# Patient Record
Sex: Male | Born: 1970 | Race: White | Hispanic: No | Marital: Married | Smoking: Former smoker
Health system: Southern US, Community
[De-identification: ages and names within clinical notes are randomized; demographics above are authoritative.]

## PROBLEM LIST (undated history)

## (undated) DIAGNOSIS — Z8249 Family history of ischemic heart disease and other diseases of the circulatory system: Secondary | ICD-10-CM

## (undated) DIAGNOSIS — E039 Hypothyroidism, unspecified: Secondary | ICD-10-CM

## (undated) DIAGNOSIS — R079 Chest pain, unspecified: Secondary | ICD-10-CM

## (undated) HISTORY — PX: ABDOMINAL SURGERY: SHX537

## (undated) HISTORY — DX: Family history of ischemic heart disease and other diseases of the circulatory system: Z82.49

## (undated) HISTORY — DX: Hypothyroidism, unspecified: E03.9

## (undated) HISTORY — DX: Chest pain, unspecified: R07.9

---
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
## 2009-01-27 ENCOUNTER — Encounter (HOSPITAL_COMMUNITY): Admission: RE | Admit: 2009-01-27 | Discharge: 2009-04-26 | Payer: Self-pay | Admitting: Endocrinology

## 2010-10-14 ENCOUNTER — Encounter: Payer: Self-pay | Admitting: Endocrinology

## 2013-03-29 ENCOUNTER — Other Ambulatory Visit: Payer: Self-pay | Admitting: Endocrinology

## 2013-03-30 ENCOUNTER — Other Ambulatory Visit: Payer: Self-pay | Admitting: Endocrinology

## 2013-03-30 MED ORDER — LEVOTHYROXINE SODIUM 112 MCG PO TABS: 112.0000 ug | ORAL_TABLET | Freq: Every day | ORAL | Status: DC

## 2013-10-29 ENCOUNTER — Encounter: Payer: Self-pay | Admitting: Cardiovascular Disease

## 2013-10-29 ENCOUNTER — Ambulatory Visit (INDEPENDENT_AMBULATORY_CARE_PROVIDER_SITE_OTHER): Payer: BC Managed Care – PPO | Admitting: Cardiovascular Disease

## 2013-10-29 VITALS — BP 128/92 | HR 89 | Ht 71.0 in | Wt 230.0 lb

## 2013-10-29 DIAGNOSIS — R079 Chest pain, unspecified: Secondary | ICD-10-CM | POA: Insufficient documentation

## 2013-10-29 DIAGNOSIS — Z8249 Family history of ischemic heart disease and other diseases of the circulatory system: Secondary | ICD-10-CM | POA: Insufficient documentation

## 2013-10-29 MED ORDER — ASPIRIN EC 81 MG PO TBEC: 81.0000 mg | DELAYED_RELEASE_TABLET | Freq: Every day | ORAL | Status: DC

## 2013-10-29 NOTE — Assessment & Plan Note (Signed)
Patient developed chest pain this past Sunday. It left precordial pain to his left arm. His affect is included 50-pack-year history of tobacco use having quit 2 years ago. His mother died of an MI at age 42 and his brother at age 52. His lipid profile is unknown but to start him on baby aspirin, finger Grim Reaper 2 labs and order an exercise Myoview stress test. This is abnormal we will proceed with cardiac catheterization to define his anatomy.

## 2013-10-29 NOTE — Patient Instructions (Addendum)
  We will see you back in follow up after the test.  Dr Gwenlyn Found has ordered an exercise myoview at the beginning of next week.  Start Aspirin 74m daily.

## 2013-10-29 NOTE — Progress Notes (Signed)
     10/29/2013 Victor Curtis   [birth year]  [id number]  Primary Physician Horatio Pel, MD Primary Cardiologist: Victor Harp MD Renae Gloss   HPI:  Victor Curtis is a moderately overweight 43 year old married Caucasian male father of 2 children accompanied by his wife today. He is referred by Dr. Deland Pretty for evaluation of new-onset chest pain. His cardiac risk factor profile positive for a 50-pack-year tobacco use, a strong family history of heart disease. He is not diabetic and his lipid profiles addendum. He developed chest pain this past Sunday which was left precordial burning to his left upper extremity. This occurred off and on since that time. It is not associated with shortness of breath or diaphoresis.   Current Outpatient Prescriptions  Medication Sig Dispense Refill  . levothyroxine (SYNTHROID, LEVOTHROID) 112 MCG tablet TAKE 2 TABLETS BY MOUTH EVERY MORNING ON AN EMPTY STOMACH  60 tablet  0  . aspirin EC 81 MG tablet Take 1 tablet (81 mg total) by mouth daily.  90 tablet  3   No current facility-administered medications for this visit.    Allergies  Allergen Reactions  . Penicillins     History   Social History  . Marital Status: Married    Spouse Name: N/A    Number of Children: N/A  . Years of Education: N/A   Occupational History  . Not on file.   Social History Main Topics  . Smoking status: Former Smoker    Quit date: 10/30/2011  . Smokeless tobacco: Not on file  . Alcohol Use: No  . Drug Use: Not on file  . Sexual Activity: Not on file   Other Topics Concern  . Not on file   Social History Narrative  . No narrative on file     Review of Systems: General: negative for chills, fever, night sweats or weight changes.  Cardiovascular: negative for chest pain, dyspnea on exertion, edema, orthopnea, palpitations, paroxysmal nocturnal dyspnea or shortness of breath Dermatological: negative for rash Respiratory: negative for  cough or wheezing Urologic: negative for hematuria Abdominal: negative for nausea, vomiting, diarrhea, bright red blood per rectum, melena, or hematemesis Neurologic: negative for visual changes, syncope, or dizziness All other systems reviewed and are otherwise negative except as noted above.    Blood pressure 128/92, pulse 89, height 5' 11"  (1.803 m), weight 230 lb (104.327 kg).  General appearance: alert and no distress Neck: no adenopathy, no carotid bruit, no JVD, supple, symmetrical, trachea midline and thyroid not enlarged, symmetric, no tenderness/mass/nodules Lungs: clear to auscultation bilaterally Heart: regular rate and rhythm, S1, S2 normal, no murmur, click, rub or gallop Abdomen: soft, non-tender; bowel sounds normal; no masses,  no organomegaly Extremities: extremities normal, atraumatic, no cyanosis or edema Pulses: 2+ and symmetric  EKG normal sinus rhythm at 89 without ST or T wave changes  ASSESSMENT AND PLAN:   Chest pain Patient developed chest pain this past Sunday. It left precordial pain to his left arm. His affect is included 50-pack-year history of tobacco use having quit 2 years ago. His mother died of an MI at age 77 and his brother at age 36. His lipid profile is unknown but to start him on baby aspirin, finger Grim Reaper 2 labs and order an exercise Myoview stress test. This is abnormal we will proceed with cardiac catheterization to define his anatomy.      Victor Harp MD FACP,FACC,FAHA, [facility] 10/29/2013 3:56 PM

## 2013-11-05 ENCOUNTER — Ambulatory Visit (HOSPITAL_COMMUNITY)
Admission: RE | Admit: 2013-11-05 | Discharge: 2013-11-05 | Disposition: A | Discharge status: Home / Self Care | Payer: BC Managed Care – PPO | Source: Ambulatory Visit | Attending: Internal Medicine | Admitting: Internal Medicine

## 2013-11-05 ENCOUNTER — Encounter: Payer: Self-pay | Admitting: Cardiovascular Disease

## 2013-11-05 DIAGNOSIS — R002 Palpitations: Secondary | ICD-10-CM | POA: Insufficient documentation

## 2013-11-05 DIAGNOSIS — R0609 Other forms of dyspnea: Secondary | ICD-10-CM | POA: Insufficient documentation

## 2013-11-05 DIAGNOSIS — Z8249 Family history of ischemic heart disease and other diseases of the circulatory system: Secondary | ICD-10-CM | POA: Insufficient documentation

## 2013-11-05 DIAGNOSIS — R0989 Other specified symptoms and signs involving the circulatory and respiratory systems: Secondary | ICD-10-CM | POA: Insufficient documentation

## 2013-11-05 DIAGNOSIS — R42 Dizziness and giddiness: Secondary | ICD-10-CM | POA: Insufficient documentation

## 2013-11-05 DIAGNOSIS — E669 Obesity, unspecified: Secondary | ICD-10-CM | POA: Insufficient documentation

## 2013-11-05 DIAGNOSIS — R079 Chest pain, unspecified: Secondary | ICD-10-CM | POA: Insufficient documentation

## 2013-11-05 DIAGNOSIS — Z87891 Personal history of nicotine dependence: Secondary | ICD-10-CM | POA: Insufficient documentation

## 2013-11-05 MED ORDER — TECHNETIUM TC 99M SESTAMIBI GENERIC - CARDIOLITE
30.5000 | Freq: Once | INTRAVENOUS | Status: AC | PRN
  Administered 2013-11-05: 31 via INTRAVENOUS

## 2013-11-05 MED ORDER — TECHNETIUM TC 99M SESTAMIBI GENERIC - CARDIOLITE
10.9000 | Freq: Once | INTRAVENOUS | Status: AC | PRN
  Administered 2013-11-05: 10.9 via INTRAVENOUS

## 2013-11-05 NOTE — Procedures (Addendum)
[facility] [address] [place] [address] [phone]  Cardiology Nuclear Med Study  Victor Curtis is a 43 y.o. male     MRN : [id number]     DOB: [birth year]  Procedure Date: 11/05/2013  Nuclear Med Background Indication for Stress Test:  Evaluation for Ischemia History:  No prior cardiac or respiratory history reported by patient. Cardiac Risk Factors: Family History - CAD, History of Smoking and Obesity  Symptoms:  Chest Pain, Dizziness, DOE, Fatigue, Light-Headedness and Palpitations   Nuclear Pre-Procedure Caffeine/Decaff Intake:  7:00pm NPO After: 5:00am   IV Site: R Antecubital  IV 0.9% NS with Angio Cath:  22g  Chest Size (in):  46"  IV Started by: Azucena Cecil, RN  Height: 5' 11"  (1.803 m)  Cup Size: n/a  BMI:  Body mass index is 32.09 kg/(m^2). Weight:  230 lb (104.327 kg)   Tech Comments:  n/a    Nuclear Med Study 1 or 2 day study: 1 day  Stress Test Type:  Stress  Order Authorizing Provider:  Quay Burow, MD   Resting Radionuclide: Technetium 97mSestamibi  Resting Radionuclide Dose: 10.9 mCi   Stress Radionuclide:  Technetium 943mestamibi  Stress Radionuclide Dose: 30.5 mCi           Stress Protocol Rest HR: 61 Stress HR:166  Rest BP:152/100 Stress BP:213/99  Exercise Time (min): 9:15 METS: 10.40   Predicted Max HR: 179 bpm % Max HR: 92.74 bpm Rate Pressure Product: 35358  Dose of Adenosine (mg):  n/a Dose of Lexiscan: n/a mg  Dose of Atropine (mg): n/a Dose of Dobutamine: n/a mcg/kg/min (at max HR)  Stress Test Technologist: GwMellody MemosCCT Nuclear Technologist: PaImagene RichesCNMT   Rest Procedure:  Myocardial perfusion imaging was performed at rest 45 minutes following the intravenous administration of Technetium 9925mstamibi. Stress Procedure:  The patient performed treadmill exercise using a Bruce  Protocol for 9 minutes and 15 seconds. The patient stopped due to fatigue.  Patient denied any chest pain.  There were no significant ST-T wave changes.  Technetium 35m37mtamibi was injected at peak exercise and myocardial perfusion imaging was performed after a brief delay.  Transient Ischemic Dilatation (Normal <1.22):  0.94 Lung/Heart Ratio (Normal <0.45):  0.33 QGS EDV:  92 ml QGS ESV:  43 ml LV Ejection Fraction: 53%   PHYSICIAN INTERPRETATION:  Rest ECG: NSR with non-specific ST-T wave changes and Early Repolarization changes in Anterolateral leads.  Stress ECG: No significant change from baseline ECG and No significant ST segment change suggestive of ischemia.  QPS Raw Data Images:  Mild diaphragmatic attenuation.  Normal left ventricular size. Stress Images:  There is reduced uptake in the basal septal wall - more related to inaccurate selection, not in a usual vascular distribution. Rest Images:  Comparison with the stress images reveals no significant change.  Again, reduced septal uptake. Subtraction (SDS):  No reversibility is appreciated. There is no evidence of scar or ischemia.  Impression Exercise Capacity:  Good exercise capacity. BP Response:  Baseline Hypertension with Systolic Hypertensive response, but appropriate diastolic pressure response. Clinical Symptoms:  fatigue ECG Impression:  No significant ST segment change suggestive of ischemia. Comparison with Prior Nuclear Study: No images to compare LV Wall Motion:  NL LV Function; NL Wall Motion  Overall Impression:  Normal stress nuclear study. and Low risk stress nuclear study with likely artifactual septal filling defect - not suggestive of a vascular distribution..Marland Kitchen  [name], MD  11/05/2013 4:37 PM

## 2013-11-08 ENCOUNTER — Encounter: Payer: Self-pay | Admitting: *Deleted

## 2013-11-26 ENCOUNTER — Ambulatory Visit (INDEPENDENT_AMBULATORY_CARE_PROVIDER_SITE_OTHER): Payer: BC Managed Care – PPO | Admitting: Cardiovascular Disease

## 2013-11-26 ENCOUNTER — Encounter: Payer: Self-pay | Admitting: Cardiovascular Disease

## 2013-11-26 VITALS — BP 136/88 | HR 61 | Ht 71.0 in | Wt 238.4 lb

## 2013-11-26 DIAGNOSIS — R079 Chest pain, unspecified: Secondary | ICD-10-CM

## 2013-11-26 NOTE — Assessment & Plan Note (Signed)
The patient had a Myoview stress test performed on 11/05/13 which was entirely normal. Since that time he had minimal atypical chest pain. I reassured him that it is unlikely that his pain is anginal. I will see him back in 6 months.

## 2013-11-26 NOTE — Patient Instructions (Signed)
Your physician wants you to follow-up in: 6 months with Dr Berry. You will receive a reminder letter in the mail two months in advance. If you don't receive a letter, please call our office to schedule the follow-up appointment.  

## 2013-11-26 NOTE — Progress Notes (Signed)
     11/26/2013 Chapman Moss   08/31/1971  [id number]  Primary Physician Horatio Pel, MD Primary Cardiologist: Lorretta Harp MD Renae Gloss   HPI:  Mr. Victor Curtis is a moderately overweight 43 year old married Caucasian male father of 2 children accompanied by his wife today. He is referred by Dr. Deland Pretty for evaluation of new-onset chest pain. His cardiac risk factor profile positive for a 50-pack-year tobacco use, a strong family history of heart disease. He is not diabetic and his lipid profiles addendum. He developed chest pain this past Sunday which was left precordial burning to his left upper extremity. This occurred off and on since that time. It is not associated with shortness of breath or diaphoresis.I ordered a exercise Myoview stress test on 11/05/13 which is in telemetry normal. Since that time he had minimal atypical episodes of chest pain.    Current Outpatient Prescriptions  Medication Sig Dispense Refill  . aspirin EC 81 MG tablet Take 1 tablet (81 mg total) by mouth daily.  90 tablet  3  . levothyroxine (SYNTHROID, LEVOTHROID) 112 MCG tablet TAKE 2 TABLETS BY MOUTH EVERY MORNING ON AN EMPTY STOMACH  60 tablet  0   No current facility-administered medications for this visit.    Allergies  Allergen Reactions  . Penicillins     History   Social History  . Marital Status: Married    Spouse Name: N/A    Number of Children: N/A  . Years of Education: N/A   Occupational History  . Not on file.   Social History Main Topics  . Smoking status: Former Smoker    Quit date: 10/30/2011  . Smokeless tobacco: Not on file  . Alcohol Use: No  . Drug Use: Not on file  . Sexual Activity: Not on file   Other Topics Concern  . Not on file   Social History Narrative  . No narrative on file     Review of Systems: General: negative for chills, fever, night sweats or weight changes.  Cardiovascular: negative for chest pain, dyspnea on exertion,  edema, orthopnea, palpitations, paroxysmal nocturnal dyspnea or shortness of breath Dermatological: negative for rash Respiratory: negative for cough or wheezing Urologic: negative for hematuria Abdominal: negative for nausea, vomiting, diarrhea, bright red blood per rectum, melena, or hematemesis Neurologic: negative for visual changes, syncope, or dizziness All other systems reviewed and are otherwise negative except as noted above.    Blood pressure 136/88, pulse 61, height 5' 11"  (1.803 m), weight 108.138 kg (238 lb 6.4 oz).  General appearance: alert and no distress Neck: no adenopathy, no carotid bruit, no JVD, supple, symmetrical, trachea midline and thyroid not enlarged, symmetric, no tenderness/mass/nodules Lungs: clear to auscultation bilaterally Heart: regular rate and rhythm, S1, S2 normal, no murmur, click, rub or gallop Extremities: extremities normal, atraumatic, no cyanosis or edema  EKG normal sinus rhythm at 61 without ST or T wave changes  ASSESSMENT AND PLAN:   Chest pain The patient had a Myoview stress test performed on 11/05/13 which was entirely normal. Since that time he had minimal atypical chest pain. I reassured him that it is unlikely that his pain is anginal. I will see him back in 6 months.      Lorretta Harp MD FACP,FACC,FAHA, [facility] 11/26/2013 11:03 AM

## 2014-01-13 ENCOUNTER — Other Ambulatory Visit (INDEPENDENT_AMBULATORY_CARE_PROVIDER_SITE_OTHER): Payer: BC Managed Care – PPO

## 2014-01-13 ENCOUNTER — Other Ambulatory Visit: Payer: Self-pay | Admitting: *Deleted

## 2014-01-13 DIAGNOSIS — E890 Postprocedural hypothyroidism: Secondary | ICD-10-CM | POA: Insufficient documentation

## 2014-01-13 DIAGNOSIS — E039 Hypothyroidism, unspecified: Secondary | ICD-10-CM

## 2014-01-13 LAB — TSH: TSH: 10.48 u[IU]/mL — ABNORMAL HIGH (ref 0.35–5.50)

## 2014-01-13 LAB — T4, FREE: Free T4: 0.73 ng/dL (ref 0.60–1.60)

## 2014-01-18 ENCOUNTER — Encounter: Payer: Self-pay | Admitting: Endocrinology

## 2014-01-18 ENCOUNTER — Ambulatory Visit (INDEPENDENT_AMBULATORY_CARE_PROVIDER_SITE_OTHER): Payer: BC Managed Care – PPO | Admitting: Endocrinology

## 2014-01-18 VITALS — BP 130/84 | HR 81 | Temp 98.3°F | Resp 16 | Ht 71.0 in | Wt 236.2 lb

## 2014-01-18 DIAGNOSIS — E890 Postprocedural hypothyroidism: Secondary | ICD-10-CM

## 2014-01-18 MED ORDER — LEVOTHYROXINE SODIUM 125 MCG PO TABS: 250.0000 ug | ORAL_TABLET | Freq: Every day | ORAL | Status: DC

## 2014-01-18 NOTE — Progress Notes (Signed)
Patient ID: Victor Curtis, male   DOB: 1971/01/12, 43 y.o.   MRN: [id number]    Reason for Appointment:  Hypothyroidism, followup visit   History of Present Illness:   The hypothyroidism was first diagnosed in 2010 after treatment of hyperthyroidism with I-131  The patient has been treated with generic levothyroxine and last year was taking 112 mcg, 2 tablets daily On the last visit the dose was continued unchanged   Patient has no complaints of unusual fatigue, cold sensitivity, dry skin, unusual weight gain.           The patient is taking the thyroid supplement consistently in the morning before breakfast including recently.  Not taking any OTC supplements with his thyroid supplement.     Appointment on 01/13/2014  Component Date Value Ref Range Status  . TSH 01/13/2014 10.48* 0.35 - 5.50 uIU/mL Final  . Free T4 01/13/2014 0.73  0.60 - 1.60 ng/dL Final      Medication List       This list is accurate as of: 01/18/14  9:28 AM.  Always use your most recent med list.               aspirin EC 81 MG tablet  Take 1 tablet (81 mg total) by mouth daily.     levothyroxine 112 MCG tablet  Commonly known as:  SYNTHROID, LEVOTHROID  TAKE 2 TABLETS BY MOUTH EVERY MORNING ON AN EMPTY STOMACH        Allergies:  Allergies  Allergen Reactions  . Penicillins     Past Medical History  Diagnosis Date  . Hypothyroidism   . Chest pain   . Family history of heart disease     Past Surgical History  Procedure Laterality Date  . Appendectomy  1983  . 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 Diabetes Mother   . Heart attack Mother   . Diabetes Father   . Heart attack Brother   . Heart failure Brother     Social History:  reports that he quit smoking about 2 years ago. He does not have any smokeless tobacco history on file. He reports that he does not drink alcohol. His drug history is not on  file.  REVIEW Of SYSTEMS:  He has been followed by his PCP Tends to have relatively high blood pressure readings in the office but has not been told to have hypertension   Examination:   BP 130/84  Pulse 81  Temp(Src) 98.3 F (36.8 C)  Resp 16  Ht 5' 11"  (1.803 m)  Wt 236 lb 3.2 oz (107.14 kg)  BMI 32.96 kg/m2  SpO2 98%  GENERAL APPEARANCE: Alert, no puffiness of the face  Eyes: Minimal swelling of the eyelids  NECK: Thyroid is not palpable           NEUROLOGIC EXAM:  biceps reflexes are difficult to elicit Skin: Not unusual dry    Assessment   Hypothyroidism, post ablative with increased TSH now on current regimen of 224 mcg levothyroxine He appears to be relatively asymptomatic He thinks he is compliant with his supplement    Treatment:  We will change the dose to 250 mcg using 2 tablets of 125 He will need followup in 3 months  Remmington Teters 01/18/2014, 9:28 AM

## 2014-04-22 ENCOUNTER — Other Ambulatory Visit: Payer: BC Managed Care – PPO

## 2014-04-29 ENCOUNTER — Ambulatory Visit: Payer: BC Managed Care – PPO | Admitting: Endocrinology

## 2014-05-06 ENCOUNTER — Other Ambulatory Visit (INDEPENDENT_AMBULATORY_CARE_PROVIDER_SITE_OTHER): Payer: BC Managed Care – PPO

## 2014-05-06 DIAGNOSIS — E890 Postprocedural hypothyroidism: Secondary | ICD-10-CM

## 2014-05-06 LAB — T4, FREE: FREE T4: 1.65 ng/dL — AB (ref 0.60–1.60)

## 2014-05-06 LAB — TSH: TSH: 0.07 u[IU]/mL — AB (ref 0.35–4.50)

## 2014-05-13 ENCOUNTER — Ambulatory Visit: Payer: BC Managed Care – PPO | Admitting: Endocrinology

## 2014-05-13 ENCOUNTER — Ambulatory Visit (INDEPENDENT_AMBULATORY_CARE_PROVIDER_SITE_OTHER): Payer: BC Managed Care – PPO | Admitting: Endocrinology

## 2014-05-13 ENCOUNTER — Encounter: Payer: Self-pay | Admitting: Endocrinology

## 2014-05-13 VITALS — BP 120/76 | HR 78 | Temp 98.4°F | Resp 16 | Ht 71.0 in | Wt 230.2 lb

## 2014-05-13 DIAGNOSIS — E890 Postprocedural hypothyroidism: Secondary | ICD-10-CM

## 2014-05-13 MED ORDER — SYNTHROID 112 MCG PO TABS: 224.0000 ug | ORAL_TABLET | Freq: Every day | ORAL | Status: DC

## 2014-05-13 NOTE — Progress Notes (Signed)
Patient ID: Victor Curtis, male   DOB: 1971/09/16, 43 y.o.   MRN: [id number]    Reason for Appointment:  Hypothyroidism, followup visit   History of Present Illness:   The hypothyroidism was first diagnosed in 2010 after treatment of hyperthyroidism with I-131  The patient has been treated with generic levothyroxine and last year was taking 112 mcg, 2 tablets daily On the last visit the dose was increased because of his TSH being 10.5 although he was asymptomatic He did not feel any change subjectively and also in complaining of any fatigue or palpitations/shakiness No significant weight change   The patient is taking the thyroid supplement consistently in the morning before breakfast  Not taking any OTC supplements with his thyroid supplement.    However his TSH is now low with a high free T4  Lab Results  Component Value Date   FREET4 1.65* 05/06/2014   FREET4 0.73 01/13/2014   TSH 0.07* 05/06/2014   TSH 10.48* 01/13/2014       Medication List       This list is accurate as of: 05/13/14  9:07 AM.  Always use your most recent med list.               aspirin EC 81 MG tablet  Take 1 tablet (81 mg total) by mouth daily.     levothyroxine 125 MCG tablet  Commonly known as:  SYNTHROID, LEVOTHROID  Take 2 tablets (250 mcg total) by mouth daily.        Allergies:  Allergies  Allergen Reactions  . Penicillins     Past Medical History  Diagnosis Date  . Hypothyroidism   . Chest pain   . Family history of heart disease     Past Surgical History  Procedure Laterality Date  . Appendectomy  1983  . 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 Diabetes Mother   . Heart attack Mother   . Diabetes Father   . Heart attack Brother   . Heart failure Brother     Social History:  reports that he quit smoking about 2 years ago. He does not have any smokeless tobacco history on file. He reports that  he does not drink alcohol. His drug history is not on file.  REVIEW Of SYSTEMS:  Tends to have relatively high blood pressure readings in the office but has not been told to have hypertension   Examination:   BP 164/86  Pulse 78  Temp(Src) 98.4 F (36.9 C)  Resp 16  Ht 5' 11"  (1.803 m)  Wt 230 lb 3.2 oz (104.418 kg)  BMI 32.12 kg/m2  SpO2 97%  Repeat blood pressure 120/76  Eyes: Minimal swelling of the eyelids NEUROLOGIC EXAM:  biceps reflexes are difficult to elicit Skin: Not unusual dry    Assessment   Hypothyroidism, post ablative with difficulty getting his TSH consistent Even though he has been very compliant with his thyroid supplement in mornings his TSH is now significantly low with increasing his dose only by 25 mcg and last visit Discussed that we should change him to a brand name Synthroid and he agrees    Treatment:  We will change the dose to Synthroid 112 mcg, 2 tablets daily  If TSH is normal in 2 months he can followup in one year   Ivy Puryear 05/13/2014, 9:07 AM

## 2014-06-24 ENCOUNTER — Other Ambulatory Visit (INDEPENDENT_AMBULATORY_CARE_PROVIDER_SITE_OTHER): Payer: BC Managed Care – PPO

## 2014-06-24 DIAGNOSIS — E890 Postprocedural hypothyroidism: Secondary | ICD-10-CM

## 2014-06-24 LAB — TSH: TSH: 0.06 u[IU]/mL — ABNORMAL LOW (ref 0.35–4.50)

## 2014-06-27 NOTE — Progress Notes (Signed)
Quick Note:  Thyroid level is still very high, need to change the dose of Synthroid to 200 mcg daily and he needs to come and see me in 2 months, may do lab same day if he is from out of town ______

## 2014-06-28 ENCOUNTER — Other Ambulatory Visit: Payer: Self-pay | Admitting: *Deleted

## 2014-06-28 MED ORDER — LEVOTHYROXINE SODIUM 200 MCG PO TABS: 200.0000 ug | ORAL_TABLET | Freq: Every day | ORAL | Status: DC

## 2014-08-26 ENCOUNTER — Other Ambulatory Visit (INDEPENDENT_AMBULATORY_CARE_PROVIDER_SITE_OTHER): Payer: BC Managed Care – PPO

## 2014-08-26 ENCOUNTER — Ambulatory Visit: Payer: BC Managed Care – PPO | Admitting: Endocrinology

## 2014-08-26 DIAGNOSIS — E890 Postprocedural hypothyroidism: Secondary | ICD-10-CM

## 2014-08-28 LAB — TSH: TSH: 0.33 u[IU]/mL — ABNORMAL LOW (ref 0.35–4.50)

## 2014-09-06 ENCOUNTER — Telehealth: Payer: Self-pay | Admitting: Endocrinology

## 2014-09-06 NOTE — Telephone Encounter (Signed)
Pt calling back for labs--just leave it on voicemail if needed

## 2014-11-04 ENCOUNTER — Other Ambulatory Visit: Payer: BC Managed Care – PPO

## 2014-11-04 ENCOUNTER — Other Ambulatory Visit: Payer: Self-pay | Admitting: *Deleted

## 2014-11-04 ENCOUNTER — Encounter: Payer: Self-pay | Admitting: Endocrinology

## 2014-11-04 ENCOUNTER — Ambulatory Visit (INDEPENDENT_AMBULATORY_CARE_PROVIDER_SITE_OTHER): Payer: BLUE CROSS/BLUE SHIELD | Admitting: Endocrinology

## 2014-11-04 DIAGNOSIS — E039 Hypothyroidism, unspecified: Secondary | ICD-10-CM

## 2014-11-04 LAB — TSH: TSH: 0.51 u[IU]/mL (ref 0.35–4.50)

## 2014-11-04 LAB — T4, FREE: Free T4: 1.04 ng/dL (ref 0.60–1.60)

## 2014-11-04 MED ORDER — LEVOTHYROXINE SODIUM 200 MCG PO TABS: 200.0000 ug | ORAL_TABLET | Freq: Every day | ORAL | Status: DC

## 2014-11-04 NOTE — Progress Notes (Signed)
Patient ID: Victor Curtis, male   DOB: 01/04/1971, 44 y.o.   MRN: [id number]    Reason for Appointment:  Hypothyroidism, followup visit   History of Present Illness:   The hypothyroidism was first diagnosed in 2010 after treatment of hyperthyroidism with I-131  The patient has been treated with generic levothyroxine and has been taking variable doses of levothyroxine Previously the Synthroid dose was increased because of his TSH being 10.5 although he was asymptomatic Subsequently his TSH has been persistently low and her dose has been gradually reduced He has been on 200 g daily since 08/2014 Usually does not have any symptoms related to abnormal TSH levels either high or low.  More recently has had no unusual fatigue, heat or cold intolerance or palpitations  The patient is taking the thyroid supplement consistently in the morning before breakfast  Not taking any OTC supplements with his thyroid supplement.    TSH to be checked today  Lab Results  Component Value Date   FREET4 1.65* 05/06/2014   FREET4 0.73 01/13/2014   TSH 0.33* 08/26/2014   TSH 0.06* 06/24/2014   TSH 0.07* 05/06/2014       Medication List       This list is accurate as of: 11/04/14  3:23 PM.  Always use your most recent med list.               levothyroxine 200 MCG tablet  Commonly known as:  SYNTHROID  Take 1 tablet (200 mcg total) by mouth daily before breakfast.        Allergies:  Allergies  Allergen Reactions  . Penicillins     Past Medical History  Diagnosis Date  . Hypothyroidism   . Chest pain   . Family history of heart disease     Past Surgical History  Procedure Laterality Date  . Appendectomy  1983  . 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 Diabetes Mother   . Heart attack Mother   . Diabetes Father   . Heart attack Brother   . Heart failure Brother     Social History:  reports that he  quit smoking about 3 years ago. He does not have any smokeless tobacco history on file. He reports that he does not drink alcohol. His drug history is not on file.  REVIEW Of SYSTEMS:  Wt Readings from Last 3 Encounters:  11/04/14 239 lb 3.2 oz (108.5 kg)  05/13/14 230 lb 3.2 oz (104.418 kg)  01/18/14 236 lb 3.2 oz (107.14 kg)   Tends to have relatively high blood pressure readings in the office     Examination:   BP 145/89 mmHg  Pulse 80  Temp(Src) 98 F (36.7 C)  Resp 16  Ht 5' 11"  (1.803 m)  Wt 239 lb 3.2 oz (108.5 kg)  BMI 33.38 kg/m2  SpO2 97%  Repeat blood pressure 140/88  Eyes: Mild swelling of the eyelids NEUROLOGIC EXAM:  biceps reflexes are difficult to elicit Skin: Not unusual dry    Assessment   Hypothyroidism, post ablative with difficulty getting his TSH consistent and requiring lower doses over the last 3 visits He has been on Synthroid brand name and is compliant with his supplement Currently feels asymptomatic  ?  Hypertension: He needs to follow-up with PCP.  His blood pressure does not appear to be consistently high on each visit   Treatment:  If  TSH is normal today he can followup in 6 months on the same dose of 200 g daily   Victor Curtis 11/04/2014, 3:23 PM

## 2014-11-04 NOTE — Patient Instructions (Signed)
Check BP at drug store

## 2014-11-06 NOTE — Progress Notes (Signed)
Quick Note:  Please let patient know that the lab result is normal and no change in dosage needed ______

## 2015-04-25 ENCOUNTER — Other Ambulatory Visit: Payer: Self-pay | Admitting: Endocrinology

## 2015-05-10 ENCOUNTER — Other Ambulatory Visit: Payer: BC Managed Care – PPO

## 2015-05-15 ENCOUNTER — Ambulatory Visit: Payer: BC Managed Care – PPO | Admitting: Endocrinology

## 2015-06-09 ENCOUNTER — Other Ambulatory Visit (INDEPENDENT_AMBULATORY_CARE_PROVIDER_SITE_OTHER): Payer: BLUE CROSS/BLUE SHIELD

## 2015-06-09 ENCOUNTER — Encounter: Payer: Self-pay | Admitting: Endocrinology

## 2015-06-09 ENCOUNTER — Ambulatory Visit (INDEPENDENT_AMBULATORY_CARE_PROVIDER_SITE_OTHER): Payer: BLUE CROSS/BLUE SHIELD | Admitting: Endocrinology

## 2015-06-09 VITALS — BP 152/94 | HR 82 | Temp 98.5°F | Resp 16 | Ht 71.0 in | Wt 238.2 lb

## 2015-06-09 DIAGNOSIS — E890 Postprocedural hypothyroidism: Secondary | ICD-10-CM

## 2015-06-09 DIAGNOSIS — E039 Hypothyroidism, unspecified: Secondary | ICD-10-CM

## 2015-06-09 LAB — TSH: TSH: 0.49 u[IU]/mL (ref 0.35–4.50)

## 2015-06-09 LAB — T4, FREE: Free T4: 1.07 ng/dL (ref 0.60–1.60)

## 2015-06-09 NOTE — Progress Notes (Signed)
Quick Note:  Please let patient know that the lab result is normal and no further action needed ______

## 2015-06-09 NOTE — Progress Notes (Signed)
Patient ID: Victor Curtis, male   DOB: 1971/03/02, 44 y.o.   MRN: [id number]    Reason for Appointment:  Hypothyroidism, followup visit   History of Present Illness:   The hypothyroidism was first diagnosed in 2010 after treatment of hyperthyroidism with I-131  The patient has been treated with generic levothyroxine and has been taking variable doses of levothyroxine in the past Previously the Synthroid dose was increased because of his TSH being 10.5 although he was asymptomatic Subsequently his TSH had been persistently low and her dose has been gradually reduced  He has been on 200 g daily since 08/2014 Usually does not have any symptoms related to abnormal TSH levels either high or low.  Recently has had no unusual fatigue, heat or cold intolerance or palpitations, no recent weight gain or loss  The patient is taking the thyroid supplement consistently in the morning before breakfast  Not taking any OTC supplements with his thyroid supplement.    TSH to be checked today  Lab Results  Component Value Date   FREET4 1.04 11/04/2014   FREET4 1.65* 05/06/2014   FREET4 0.73 01/13/2014   TSH 0.51 11/04/2014   TSH 0.33* 08/26/2014   TSH 0.06* 06/24/2014       Medication List       This list is accurate as of: 06/09/15  3:26 PM.  Always use your most recent med list.               levothyroxine 200 MCG tablet  Commonly known as:  SYNTHROID, LEVOTHROID  TAKE 1 TABLET BY MOUTH EVERY DAY BEFORE BREAKFAST        Allergies:  Allergies  Allergen Reactions  . Penicillins     Past Medical History  Diagnosis Date  . Hypothyroidism   . Chest pain   . Family history of heart disease     Past Surgical History  Procedure Laterality Date  . Appendectomy  1983  . 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 Diabetes Mother   . Heart attack Mother   . Diabetes Father   . Heart  attack Brother   . Heart failure Brother     Social History:  reports that he quit smoking about 3 years ago. He does not have any smokeless tobacco history on file. He reports that he does not drink alcohol. His drug history is not on file.  REVIEW Of SYSTEMS:  He has some weight gain since last year  Wt Readings from Last 3 Encounters:  06/09/15 238 lb 3.2 oz (108.047 kg)  11/04/14 239 lb 3.2 oz (108.5 kg)  05/13/14 230 lb 3.2 oz (104.418 kg)   Tends to have relatively high blood pressure readings in the office and still has not gone to his PCP for evaluation and management   Examination:   BP 152/94 mmHg  Pulse 82  Temp(Src) 98.5 F (36.9 C)  Resp 16  Ht 5' 11"  (1.803 m)  Wt 238 lb 3.2 oz (108.047 kg)  BMI 33.24 kg/m2  SpO2 96%   Eyes: Mild swelling of the eyelids Thyroid exam is normal NEUROLOGIC EXAM:  biceps reflexes are difficult to elicit Skin: Not unusual dry    Assessment   Hypothyroidism, post ablative with more recently stable levels He has been on Synthroid brand name and is compliant with his supplement Currently feels asymptomatic but needs follow-up levels of  TSH and free T4  ?  Hypertension: Most likely he does have hypertension.  He needs to follow-up with PCP and this was emphasized   Treatment:   If TSH is normal today he can followup in 12 months on the same dose of 200 g daily   KUMAR,AJAY 06/09/2015, 3:26 PM   Appointment on 06/09/2015  Component Date Value Ref Range Status  . Free T4 06/09/2015 1.07  0.60 - 1.60 ng/dL Final  . TSH 06/09/2015 0.49  0.35 - 4.50 uIU/mL Final

## 2015-07-28 ENCOUNTER — Other Ambulatory Visit: Payer: Self-pay | Admitting: Endocrinology

## 2016-01-25 ENCOUNTER — Other Ambulatory Visit: Payer: Self-pay | Admitting: Endocrinology

## 2016-03-27 ENCOUNTER — Other Ambulatory Visit: Payer: Self-pay | Admitting: Endocrinology

## 2016-05-28 ENCOUNTER — Other Ambulatory Visit: Payer: BLUE CROSS/BLUE SHIELD

## 2016-05-29 ENCOUNTER — Other Ambulatory Visit (INDEPENDENT_AMBULATORY_CARE_PROVIDER_SITE_OTHER): Payer: BLUE CROSS/BLUE SHIELD

## 2016-05-29 DIAGNOSIS — E890 Postprocedural hypothyroidism: Secondary | ICD-10-CM

## 2016-05-29 LAB — T4, FREE: FREE T4: 1.04 ng/dL (ref 0.60–1.60)

## 2016-05-29 LAB — TSH: TSH: 1.49 u[IU]/mL (ref 0.35–4.50)

## 2016-05-31 ENCOUNTER — Ambulatory Visit: Payer: BLUE CROSS/BLUE SHIELD | Admitting: Endocrinology

## 2016-06-07 ENCOUNTER — Ambulatory Visit (INDEPENDENT_AMBULATORY_CARE_PROVIDER_SITE_OTHER): Payer: BLUE CROSS/BLUE SHIELD | Admitting: Endocrinology

## 2016-06-07 ENCOUNTER — Encounter: Payer: Self-pay | Admitting: Endocrinology

## 2016-06-07 VITALS — BP 135/88 | HR 77 | Temp 98.1°F | Resp 96 | Ht 71.0 in | Wt 240.2 lb

## 2016-06-07 DIAGNOSIS — E890 Postprocedural hypothyroidism: Secondary | ICD-10-CM | POA: Diagnosis not present

## 2016-06-07 MED ORDER — LEVOTHYROXINE SODIUM 200 MCG PO TABS: ORAL_TABLET | ORAL | 3 refills | Status: DC

## 2016-06-07 NOTE — Progress Notes (Signed)
Patient ID: Victor Curtis, male   DOB: 06/02/1971, 45 y.o.   MRN: [id number]    Reason for Appointment:  Hypothyroidism, followup visit   History of Present Illness:   The hypothyroidism was first diagnosed in 2010 after treatment of hyperthyroidism with I-131  The patient has been treated with generic levothyroxine and has been taking variable doses of levothyroxine in the past Previously the Synthroid dose was increased because of his TSH being 10.5 although he was asymptomatic Subsequently his TSH had been persistently low and her dose has been gradually reduced  He has been on 200 g daily since 08/2014 Usually does not have any symptoms related to abnormal TSH levels either when it is high or low.  Recently has had no unusual fatigue, heat or cold intolerance, no recent weight gain or loss  The patient is taking the thyroid supplement consistently in the morning before breakfast  Not taking any vitamins with his thyroid supplement.    TSH appears to be very consistent now   Lab Results  Component Value Date   FREET4 1.04 05/29/2016   FREET4 1.07 06/09/2015   FREET4 1.04 11/04/2014   TSH 1.49 05/29/2016   TSH 0.49 06/09/2015   TSH 0.51 11/04/2014       Medication List       Accurate as of 06/07/16 11:25 AM. Always use your most recent med list.          levothyroxine 200 MCG tablet Commonly known as:  SYNTHROID, LEVOTHROID TAKE 1 TABLET BY MOUTH EVERY DAY BEFORE BREAKFAST       Allergies:  Allergies  Allergen Reactions  . Penicillins     Past Medical History:  Diagnosis Date  . Chest pain   . Family history of heart disease   . Hypothyroidism     Past Surgical History:  Procedure Laterality Date  . 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 APPENDECTOMY  1983    Family History  Problem Relation Age of Onset  . Diabetes Mother   . Heart attack Mother   . Diabetes Father   . Heart attack Brother   . Heart  failure Brother     Social History:  reports that he quit smoking about 4 years ago. He does not have any smokeless tobacco history on file. He reports that he does not drink alcohol. His drug history is not on file.  REVIEW Of SYSTEMS:  He has no significant weight gain since last year  Wt Readings from Last 3 Encounters:  06/07/16 240 lb 3.2 oz (109 kg)  06/09/15 238 lb 3.2 oz (108 kg)  11/04/14 239 lb 3.2 oz (108.5 kg)   Tends to have relatively high blood pressure readings in the office and Does not see his PCP regularly.   Examination:   BP 135/88   Pulse 77   Temp 98.1 F (36.7 C)   Resp (!) 96   Ht 5' 11"  (1.803 m)   Wt 240 lb 3.2 oz (109 kg)   BMI 33.50 kg/m    Eyes: Mild swelling of the eyelids Anterior neck exam is normal NEUROLOGIC EXAM:  biceps reflexes are difficult to elicit Skin appears normal No peripheral edema    Assessment   Hypothyroidism, post ablative with more recently stable levels He has been on Synthroid brand name and is compliant with his supplement Currently feels asymptomatic and looks euthyroid TSH has been very consistent now  ?  Hypertension:  Most likely he does have mild hypertension, however not clear if he has quite good hypertension .  He needs to follow-up with PCP   Treatment:   Since TSH is normal again he can followup in 12 months on the same dose of 200 g daily   Afshin Chrystal 06/07/2016, 11:25 AM   No visits with results within 1 Week(s) from this visit.  Latest known visit with results is:  Lab on 05/29/2016  Component Date Value Ref Range Status  . TSH 05/29/2016 1.49  0.35 - 4.50 uIU/mL Final  . Free T4 05/29/2016 1.04  0.60 - 1.60 ng/dL Final

## 2017-05-30 ENCOUNTER — Other Ambulatory Visit (INDEPENDENT_AMBULATORY_CARE_PROVIDER_SITE_OTHER): Payer: BLUE CROSS/BLUE SHIELD

## 2017-05-30 DIAGNOSIS — E890 Postprocedural hypothyroidism: Secondary | ICD-10-CM | POA: Diagnosis not present

## 2017-05-30 LAB — TSH: TSH: 2.48 u[IU]/mL (ref 0.35–4.50)

## 2017-05-30 LAB — T4, FREE: FREE T4: 1.15 ng/dL (ref 0.60–1.60)

## 2017-06-05 NOTE — Progress Notes (Signed)
Patient ID: Victor Curtis, male   DOB: 07-Jul-1971, 46 y.o.   MRN: [id number]    Reason for Appointment:  Hypothyroidism, followup visit   History of Present Illness:   The hypothyroidism was first diagnosed in 2010 after treatment of hyperthyroidism with I-131  The patient has been treated with generic levothyroxine and has been taking variable doses of levothyroxine in the past Previously the Synthroid dose was increased because of his TSH being 10.5 although he was asymptomatic Subsequently his TSH had been persistently low and her dose has been gradually reduced  He has been on brand name SYNTHROID 200 g daily since 08/2014 Usually does not have any symptoms related to abnormal TSH levels either when it is high or low.  Recently has had no unusual fatigue He has lost 6 pounds No symptoms of cold intolerance or skin changes  The patient is taking the thyroid supplement consistently in the morning before breakfast  Not taking any vitamins with his thyroid supplement.    TSH appears to be again consistent in the normal range   Lab Results  Component Value Date   FREET4 1.15 05/30/2017   FREET4 1.04 05/29/2016   FREET4 1.07 06/09/2015   TSH 2.48 05/30/2017   TSH 1.49 05/29/2016   TSH 0.49 06/09/2015     Allergies as of 06/06/2017      Reactions   Penicillins       Medication List       Accurate as of 06/06/17 11:59 PM. Always use your most recent med list.          levothyroxine 200 MCG tablet Commonly known as:  SYNTHROID, LEVOTHROID TAKE 1 TABLET BY MOUTH EVERY DAY BEFORE BREAKFAST       Allergies:  Allergies  Allergen Reactions  . Penicillins     Past Medical History:  Diagnosis Date  . Chest pain   . Family history of heart disease   . Hypothyroidism     Past Surgical History:  Procedure Laterality Date  . 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 APPENDECTOMY  1983    Family History  Problem Relation  Age of Onset  . Diabetes Mother   . Heart attack Mother   . Diabetes Father   . Heart attack Brother   . Heart failure Brother     Social History:  reports that he quit smoking about 5 years ago. He has never used smokeless tobacco. He reports that he does not drink alcohol. His drug history is not on file.  REVIEW Of SYSTEMS:  He has lost a little weight recently   Wt Readings from Last 3 Encounters:  06/06/17 234 lb 12.8 oz (106.5 kg)  06/07/16 240 lb 3.2 oz (109 kg)  06/09/15 238 lb 3.2 oz (108 kg)   Tends to have relatively high blood pressure readings in the office Has been advised to see  PCP But has not seen seen  BP Readings from Last 3 Encounters:  06/06/17 134/82  06/07/16 135/88  06/09/15 (!) 152/94     Examination:   BP 134/82 (BP Location: Left Arm)   Pulse 75   Ht 5' 11"  (1.803 m)   Wt 234 lb 12.8 oz (106.5 kg)   SpO2 98%   BMI 32.75 kg/m    Eyes: Mild swelling  Biceps reflexes appear normal Skin is normal No peripheral edema    Assessment   Hypothyroidism, post ablative with more recently stable levels  He has been on Synthroid brand name and is compliant with his supplement Currently feels asymptomatic and looks euthyroid TSH has been very consistent now  ?  Hypertension: Most likely he does have mild hypertension, blood pressure relatively better today  Discussed with patient that since he is a strong family history of diabetes he needs to establish with a PCP for a full exam and he will contact Dr. Shelia Media Currently no glucose levels/ lipids available  Total visit time insulin 15 minutes   Treatment:   Since TSH is normal again he can follow up in 12 months on the same dose of Brand name Synthroid 200 g daily   Soley Harriss 06/07/2017, 9:45 AM

## 2017-06-06 ENCOUNTER — Ambulatory Visit (INDEPENDENT_AMBULATORY_CARE_PROVIDER_SITE_OTHER): Payer: BLUE CROSS/BLUE SHIELD | Admitting: Endocrinology

## 2017-06-06 ENCOUNTER — Encounter: Payer: Self-pay | Admitting: Endocrinology

## 2017-06-06 VITALS — BP 134/82 | HR 75 | Ht 71.0 in | Wt 234.8 lb

## 2017-06-06 DIAGNOSIS — E890 Postprocedural hypothyroidism: Secondary | ICD-10-CM

## 2017-06-07 MED ORDER — LEVOTHYROXINE SODIUM 200 MCG PO TABS: ORAL_TABLET | ORAL | 3 refills | Status: DC

## 2018-06-05 ENCOUNTER — Other Ambulatory Visit (INDEPENDENT_AMBULATORY_CARE_PROVIDER_SITE_OTHER): Payer: BLUE CROSS/BLUE SHIELD

## 2018-06-05 DIAGNOSIS — E890 Postprocedural hypothyroidism: Secondary | ICD-10-CM | POA: Diagnosis not present

## 2018-06-05 LAB — T4, FREE: FREE T4: 0.95 ng/dL (ref 0.60–1.60)

## 2018-06-05 LAB — TSH: TSH: 1.38 u[IU]/mL (ref 0.35–4.50)

## 2018-06-11 NOTE — Progress Notes (Signed)
Patient ID: Victor Curtis, male   DOB: 08-Mar-1971, 47 y.o.   MRN: [id number]    Reason for Appointment:  Hypothyroidism, followup visit   History of Present Illness:   The hypothyroidism was first diagnosed in 2010 after treatment of hyperthyroidism with I-131  The patient has been treated with generic levothyroxine and has been taking variable doses of levothyroxine in the past Previously the Synthroid dose was increased because of his TSH being 10.5 although he was asymptomatic Subsequently his TSH had been persistently low and her dose has been gradually reduced  He has been on brand name SYNTHROID 200 g daily since 08/2014 Usually does not have any symptoms related to abnormal TSH levels either when it is high or low.  Does not complain of any fatigue or unusual tiredness recently His weight has gone up slightly No symptoms of cold intolerance or skin changes  He is taking the thyroid supplement consistently in the morning before breakfast  Not taking any vitamins with his thyroid supplement.    TSH is again consistent in the normal range   Lab Results  Component Value Date   TSH 1.38 06/05/2018   TSH 2.48 05/30/2017   TSH 1.49 05/29/2016   FREET4 0.95 06/05/2018   FREET4 1.15 05/30/2017   FREET4 1.04 05/29/2016      Allergies as of 06/12/2018      Reactions   Penicillins       Medication List        Accurate as of 06/12/18  8:03 AM. Always use your most recent med list.          levothyroxine 200 MCG tablet Commonly known as:  SYNTHROID, LEVOTHROID TAKE 1 TABLET BY MOUTH EVERY DAY BEFORE BREAKFAST       Allergies:  Allergies  Allergen Reactions  . Penicillins     Past Medical History:  Diagnosis Date  . Chest pain   . Family history of heart disease   . Hypothyroidism     Past Surgical History:  Procedure Laterality Date  . 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 APPENDECTOMY  1983    Family History    Problem Relation Age of Onset  . Diabetes Mother   . Heart attack Mother   . Diabetes Father   . Heart attack Brother   . Heart failure Brother     Social History:  reports that he quit smoking about 6 years ago. He has never used smokeless tobacco. He reports that he does not drink alcohol. His drug history is not on file.  REVIEW Of SYSTEMS:  Weight has gone up 10 pounds  Wt Readings from Last 3 Encounters:  06/12/18 244 lb (110.7 kg)  06/06/17 234 lb 12.8 oz (106.5 kg)  06/07/16 240 lb 3.2 oz (109 kg)    Tends to have relatively high blood pressure readings in the office Has been advised to establish with a primary care physician but still has not done so  BP Readings from Last 3 Encounters:  06/12/18 132/84  06/06/17 134/82  06/07/16 135/88     Examination:   BP 132/84 (BP Location: Left Arm, Patient Position: Sitting, Cuff Size: Large)   Pulse 84   Temp 97.8 F (36.6 C) (Oral)   Ht 5' 11"  (1.803 m)   Wt 244 lb (110.7 kg)   BMI 34.03 kg/m    Has mild swelling of his eyes bilaterally but no proptosis Thyroid exam normal  Biceps reflexes on the right side show normal relaxation Skin is normal     Assessment   Hypothyroidism, post ablative with very stable levels of TSH on 200 mcg daily  He has been on Synthroid brand name and is compliant with his supplement He feels fairly good and his exam is excellent  Since he has had a very stable dose he can come back in 18 months for follow-up   Treatment:  No change in medication  He will establish with a PCP especially with his family history of diabetes   Elayne Snare 06/12/2018, 8:03 AM

## 2018-06-12 ENCOUNTER — Ambulatory Visit: Payer: BLUE CROSS/BLUE SHIELD | Admitting: Endocrinology

## 2018-06-12 ENCOUNTER — Encounter: Payer: Self-pay | Admitting: Endocrinology

## 2018-06-12 VITALS — BP 132/84 | HR 84 | Temp 97.8°F | Ht 71.0 in | Wt 244.0 lb

## 2018-06-12 DIAGNOSIS — E890 Postprocedural hypothyroidism: Secondary | ICD-10-CM

## 2018-09-01 ENCOUNTER — Other Ambulatory Visit: Payer: Self-pay

## 2018-09-01 MED ORDER — LEVOTHYROXINE SODIUM 200 MCG PO TABS: ORAL_TABLET | ORAL | 3 refills | Status: DC

## 2018-12-11 ENCOUNTER — Ambulatory Visit: Payer: BLUE CROSS/BLUE SHIELD | Admitting: Endocrinology

## 2019-09-15 ENCOUNTER — Other Ambulatory Visit: Payer: Self-pay

## 2019-09-15 MED ORDER — LEVOTHYROXINE SODIUM 200 MCG PO TABS: ORAL_TABLET | ORAL | 0 refills | Status: DC

## 2019-12-08 ENCOUNTER — Other Ambulatory Visit: Payer: Self-pay

## 2019-12-10 ENCOUNTER — Other Ambulatory Visit: Payer: Self-pay

## 2019-12-10 ENCOUNTER — Encounter: Payer: Self-pay | Admitting: Endocrinology

## 2019-12-10 ENCOUNTER — Ambulatory Visit (INDEPENDENT_AMBULATORY_CARE_PROVIDER_SITE_OTHER): Payer: 59 | Admitting: Endocrinology

## 2019-12-10 VITALS — BP 138/80 | HR 80 | Ht 71.0 in | Wt 257.2 lb

## 2019-12-10 DIAGNOSIS — Z833 Family history of diabetes mellitus: Secondary | ICD-10-CM | POA: Diagnosis not present

## 2019-12-10 DIAGNOSIS — Z131 Encounter for screening for diabetes mellitus: Secondary | ICD-10-CM | POA: Diagnosis not present

## 2019-12-10 DIAGNOSIS — E890 Postprocedural hypothyroidism: Secondary | ICD-10-CM

## 2019-12-10 LAB — TSH: TSH: 1.74 u[IU]/mL (ref 0.35–4.50)

## 2019-12-10 LAB — GLUCOSE, RANDOM: Glucose, Bld: 100 mg/dL — ABNORMAL HIGH (ref 70–99)

## 2019-12-10 LAB — T4, FREE: Free T4: 0.96 ng/dL (ref 0.60–1.60)

## 2019-12-10 NOTE — Progress Notes (Signed)
Patient ID: Victor Curtis, male   DOB: [birth year], 49 y.o.   MRN: [id number]    Reason for Appointment:  Hypothyroidism, followup visit   History of Present Illness:   The hypothyroidism was first diagnosed in 2010 after treatment of hyperthyroidism with I-131  The patient has been treated with generic levothyroxine and has been taking variable doses of levothyroxine in the past Previously the Synthroid dose was increased because of his TSH being 10.5 although he was asymptomatic Subsequently his TSH had been persistently low and her dose has been gradually reduced  He has been on brand name SYNTHROID 200 g daily since 08/2014 Usually does not have any symptoms if he has abnormal TSH levels either when it is high or low.  He has felt fairly good overall, no lethargy or fatigue His weight continues to go up gradually He says that he is not controlling his diet No heat or cold intolerance, shakiness or palpitations  He is taking the thyroid supplement consistently in the morning before breakfast  He is taking men's health vitamins but at least an hour after his thyroid supplement.    Labs as follows:   Lab Results  Component Value Date   TSH 1.38 06/05/2018   TSH 2.48 05/30/2017   TSH 1.49 05/29/2016   FREET4 0.95 06/05/2018   FREET4 1.15 05/30/2017   FREET4 1.04 05/29/2016      Allergies as of 12/10/2019      Reactions   Penicillins       Medication List       Accurate as of December 10, 2019  8:23 AM. If you have any questions, ask your nurse or doctor.        levothyroxine 200 MCG tablet Commonly known as: SYNTHROID TAKE 1 TABLET BY MOUTH EVERY DAY BEFORE BREAKFAST       Allergies:  Allergies  Allergen Reactions  . Penicillins     Past Medical History:  Diagnosis Date  . Chest pain   . Family history of heart disease   . Hypothyroidism     Past Surgical History:  Procedure Laterality Date  . ABDOMINAL SURGERY  1973   "intestines were  twisted at birth"  . APPENDECTOMY  1983    Family History  Problem Relation Age of Onset  . Diabetes Mother   . Heart attack Mother   . Diabetes Father   . Heart attack Brother   . Heart failure Brother     Social History:  reports that he quit smoking about 8 years ago. He has never used smokeless tobacco. He reports that he does not drink alcohol. No history on file for drug.  REVIEW Of SYSTEMS:  Weight has gone up 10 pounds  Wt Readings from Last 3 Encounters:  12/10/19 257 lb 3.2 oz (116.7 kg)  06/12/18 244 lb (110.7 kg)  06/06/17 234 lb 12.8 oz (106.5 kg)    Tends to have relatively high blood pressure readings in the office Has been advised to establish with a primary care physician but still has not done so  BP Readings from Last 3 Encounters:  12/10/19 138/80  06/12/18 132/84  06/06/17 134/82     Examination:   BP 138/80   Pulse 80   Ht 5' 11"  (1.803 m)   Wt 257 lb 3.2 oz (116.7 kg)   SpO2 99%   BMI 35.87 kg/m    Has mild swelling of his eyelids bilaterally Thyroid exam normal  Biceps reflexes on the  right side difficult to elicit but has normal relaxation No edema of hands or ankles     Assessment   Hypothyroidism, post ablative with consistently stable levels of TSH on Synthroid 200 mcg daily  He has been on levothyroxine since his last visit at least He has had weight gain again but no symptoms suggestive of hypothyroidism Taking his supplement daily in the morning before breakfast  He does take a multivitamin of unknown composition but usually an hour after his levothyroxine   Treatment:  Check thyroid levels today We will check glucose for diabetes screening, he is fasting today  Again emphasized the need to get of PCP for regular care Discussed that weight loss would be desirable because of his family history of diabetes  Follow-up in 18 months unless labs are abnormal     Elayne Snare 12/10/2019, 8:23 AM

## 2019-12-12 NOTE — Progress Notes (Signed)
Please call to let patient know that the thyroid results are normal, continue same dose and come back in 18 months.However blood sugar is borderline for prediabetes and needs to start working on weight loss and establish a PCP

## 2019-12-29 ENCOUNTER — Other Ambulatory Visit: Payer: Self-pay | Admitting: Endocrinology

## 2020-03-23 ENCOUNTER — Other Ambulatory Visit: Payer: Self-pay

## 2020-03-23 MED ORDER — LEVOTHYROXINE SODIUM 200 MCG PO TABS: ORAL_TABLET | ORAL | 0 refills | Status: DC

## 2020-08-11 ENCOUNTER — Other Ambulatory Visit: Payer: Self-pay | Admitting: Endocrinology

## 2020-11-30 NOTE — Progress Notes (Signed)
Subjective:   I, Wendy Poet, LAT, ATC, am serving as scribe for Dr. Lynne Leader.  I'm seeing this patient as a consultation for Dr. Deland Pretty. Note will be routed back to referring provider/PCP.  CC: Right shoulder pain  HPI: Pt is a 50 y/o male c/o R shoulder pain x 6 weeks w/ no known MOI.  He initially injured his R shoulder about 2-3 years ago when he was trying to get out of a dump truck when slipped and held onto the steering wheel w/ his R arm, pulling his R shoulder.   He never had his R shoulder looked at at that time.  Pt locates pain to his R lateral upper arm.  Neck pain: yes R shoulder mechanical symptoms: yes some Aggravating factors: R sidelying; increased pain at night; R shoulder overhead AROM both concentric and eccentric; R shoulder functional IR Treatments tried: Aspercreme;  Past medical history, Surgical history, Family history, Social history, Allergies, and medications have been entered into the medical record, reviewed.   Review of Systems: No new headache, visual changes, nausea, vomiting, diarrhea, constipation, dizziness, abdominal pain, skin rash, fevers, chills, night sweats, weight loss, swollen lymph nodes, body aches, joint swelling, muscle aches, chest pain, shortness of breath, mood changes, visual or auditory hallucinations.   Objective:    Vitals:   12/01/20 1119  BP: 120/82  Pulse: 74  SpO2: 97%   General: Well Developed, well nourished, and in no acute distress.  Neuro/Psych: Alert and oriented x3, extra-ocular muscles intact, able to move all 4 extremities, sensation grossly intact. Skin: Warm and dry, no rashes noted.  Respiratory: Not using accessory muscles, speaking in full sentences, trachea midline.  Cardiovascular: Pulses palpable, no extremity edema. Abdomen: Does not appear distended. MSK: C-spine normal-appearing Nontender midline. Normal cervical motion. Right shoulder normal. Nontender. Range of motion abduction 130  degrees. External rotation normal. Internal rotation to lumbar spine. Strength diminished abduction and external rotation 4/5 normal internal rotation. Positive Hawkins and Neer's test. Positive empty can test. Negative Yergason's and speeds test.   Lab and Radiology Results  X-ray images right shoulder obtained today personally independently interpreted. Mild AC DJD.  Otherwise no significant DJD.  No fractures visible. Await formal radiology review  Procedure: Real-time Ultrasound Guided Injection of right shoulder subacromial bursa Device: Philips Affiniti 50G Images permanently stored and available for review in PACS Ultrasound evaluation prior to injection reveals intact rotator cuff tendons with moderate subacromial bursitis.  AC joint with effusion. Verbal informed consent obtained.  Discussed risks and benefits of procedure. Warned about infection bleeding damage to structures skin hypopigmentation and fat atrophy among others. Patient expresses understanding and agreement Time-out conducted.   Noted no overlying erythema, induration, or other signs of local infection.   Skin prepped in a sterile fashion.   Local anesthesia: Topical Ethyl chloride.   With sterile technique and under real time ultrasound guidance:  40 mg of Kenalog and 2 mL of Marcaine injected into right shoulder subacromial bursa. Fluid seen entering the bursa  Completed without difficulty   Pain immediately resolved suggesting accurate placement of the medication.   Advised to call if fevers/chills, erythema, induration, drainage, or persistent bleeding.   Images permanently stored and available for review in the ultrasound unit.  Impression: Technically successful ultrasound guided injection.       Impression and Recommendations:    Assessment and Plan: 50 y.o. male with right shoulder pain thought to be due to subacromial bursitis.  Plan for  injection and physical therapy.  Recheck back in about 6  to 8 weeks.  Return sooner if needed.Marland Kitchen  PDMP not reviewed this encounter. Orders Placed This Encounter  Procedures  . DG Shoulder Right    Standing Status:   Future    Number of Occurrences:   1    Standing Expiration Date:   01/01/2021    Order Specific Question:   Reason for Exam (SYMPTOM  OR DIAGNOSIS REQUIRED)    Answer:   R shoulder pain    Order Specific Question:   Preferred imaging location?    Answer:   [facility]  . [place] LIMITED JOINT SPACE STRUCTURES UP RIGHT(NO LINKED CHARGES)    Order Specific Question:   Reason for Exam (SYMPTOM  OR DIAGNOSIS REQUIRED)    Answer:   R shoulder pain    Order Specific Question:   Preferred imaging location?    Answer:   [facility]  . Ambulatory referral to Physical Therapy    Referral Priority:   Routine    Referral Type:   Physical Medicine    Referral Reason:   Specialty Services Required    Requested Specialty:   Physical Therapy    Number of Visits Requested:   1   No orders of the defined types were placed in this encounter.   Discussed warning signs or symptoms. Please see discharge instructions. Patient expresses understanding.   The above documentation has been reviewed and is accurate and complete Lynne Leader, M.D.

## 2020-12-01 ENCOUNTER — Ambulatory Visit: Payer: Self-pay

## 2020-12-01 ENCOUNTER — Ambulatory Visit (INDEPENDENT_AMBULATORY_CARE_PROVIDER_SITE_OTHER): Payer: 59

## 2020-12-01 ENCOUNTER — Encounter: Payer: Self-pay | Admitting: Family Medicine

## 2020-12-01 ENCOUNTER — Other Ambulatory Visit: Payer: Self-pay

## 2020-12-01 ENCOUNTER — Ambulatory Visit (INDEPENDENT_AMBULATORY_CARE_PROVIDER_SITE_OTHER): Payer: 59 | Admitting: Family Medicine

## 2020-12-01 VITALS — BP 120/82 | HR 74 | Ht 71.0 in | Wt 254.8 lb

## 2020-12-01 DIAGNOSIS — M25511 Pain in right shoulder: Secondary | ICD-10-CM | POA: Diagnosis not present

## 2020-12-01 DIAGNOSIS — G8929 Other chronic pain: Secondary | ICD-10-CM | POA: Diagnosis not present

## 2020-12-01 NOTE — Patient Instructions (Addendum)
Thank you for coming in today.  Call or go to the ER if you develop a large red swollen joint with extreme pain or oozing puss.   I've referred you to Physical Therapy.  Let us know if you don't hear from them in one week.  Recheck in about 8 weeks.     Shoulder Impingement Syndrome Rehab Ask your health care provider which exercises are safe for you. Do exercises exactly as told by your health care provider and adjust them as directed. It is normal to feel mild stretching, pulling, tightness, or discomfort as you do these exercises. Stop right away if you feel sudden pain or your pain gets worse. Do not begin these exercises until told by your health care provider. Stretching and range-of-motion exercise This exercise warms up your muscles and joints and improves the movement and flexibility of your shoulder. This exercise also helps to relieve pain and stiffness. Passive horizontal adduction In passive adduction, you use your other hand to move the injured arm toward your body. The injured arm does not move on its own. In this movement, your arm is moved across your body in the horizontal plane (horizontal adduction). 1. Sit or stand and pull your left / right elbow across your chest, toward your other shoulder. Stop when you feel a gentle stretch in the back of your shoulder and upper arm. ? Keep your arm at shoulder height. ? Keep your arm as close to your body as you comfortably can. 2. Hold for __________ seconds. 3. Slowly return to the starting position. Repeat __________ times. Complete this exercise __________ times a day.   Strengthening exercises These exercises build strength and endurance in your shoulder. Endurance is the ability to use your muscles for a long time, even after they get tired. External rotation, isometric This is an exercise in which you press the back of your wrist against a door frame without moving your shoulder joint (isometric). 1. Stand or sit in a  doorway, facing the door frame. 2. Bend your left / right elbow and place the back of your wrist against the door frame. Only the back of your wrist should be touching the frame. Keep your upper arm at your side. 3. Gently press your wrist against the door frame, as if you are trying to push your arm away from your abdomen (external rotation). Press as hard as you are able without pain. ? Avoid shrugging your shoulder while you press your wrist against the door frame. Keep your shoulder blade tucked down toward the middle of your back. 4. Hold for __________ seconds. 5. Slowly release the tension, and relax your muscles completely before you repeat the exercise. Repeat __________ times. Complete this exercise __________ times a day. Internal rotation, isometric This is an exercise in which you press your palm against a door frame without moving your shoulder joint (isometric). 1. Stand or sit in a doorway, facing the door frame. 2. Bend your left / right elbow and place the palm of your hand against the door frame. Only your palm should be touching the frame. Keep your upper arm at your side. 3. Gently press your hand against the door frame, as if you are trying to push your arm toward your abdomen (internal rotation). Press as hard as you are able without pain. ? Avoid shrugging your shoulder while you press your hand against the door frame. Keep your shoulder blade tucked down toward the middle of your back. 4. Hold for  __________ seconds. 5. Slowly release the tension, and relax your muscles completely before you repeat the exercise. Repeat __________ times. Complete this exercise __________ times a day.   Scapular protraction, supine 1. Lie on your back on a firm surface (supine position). Hold a __________ weight in your left / right hand. 2. Raise your left / right arm straight into the air so your hand is directly above your shoulder joint. 3. Push the weight into the air so your shoulder  (scapula) lifts off the surface that you are lying on. The scapula will push up or forward (protraction). Do not move your head, neck, or back. 4. Hold for __________ seconds. 5. Slowly return to the starting position. Let your muscles relax completely before you repeat this exercise. Repeat __________ times. Complete this exercise __________ times a day.   Scapular retraction 1. Sit in a stable chair without armrests, or stand up. 2. Secure an exercise band to a stable object in front of you so the band is at shoulder height. 3. Hold one end of the exercise band in each hand. Your palms should face down. 4. Squeeze your shoulder blades together (retraction) and move your elbows slightly behind you. Do not shrug your shoulders upward while you do this. 5. Hold for __________ seconds. 6. Slowly return to the starting position. Repeat __________ times. Complete this exercise __________ times a day.   Shoulder extension 1. Sit in a stable chair without armrests, or stand up. 2. Secure an exercise band to a stable object in front of you so the band is above shoulder height. 3. Hold one end of the exercise band in each hand. 4. Straighten your elbows and lift your hands up to shoulder height. 5. Squeeze your shoulder blades together and pull your hands down to the sides of your thighs (extension). Stop when your hands are straight down by your sides. Do not let your hands go behind your body. 6. Hold for __________ seconds. 7. Slowly return to the starting position. Repeat __________ times. Complete this exercise __________ times a day.   This information is not intended to replace advice given to you by your health care provider. Make sure you discuss any questions you have with your health care provider. Document Revised: 01/01/2019 Document Reviewed: 10/05/2018 Elsevier Patient Education  2021 [employer].

## 2020-12-04 NOTE — Progress Notes (Signed)
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

## 2020-12-25 ENCOUNTER — Other Ambulatory Visit: Payer: Self-pay | Admitting: Internal Medicine

## 2020-12-25 DIAGNOSIS — I739 Peripheral vascular disease, unspecified: Secondary | ICD-10-CM

## 2020-12-27 ENCOUNTER — Other Ambulatory Visit: Payer: Self-pay | Admitting: Internal Medicine

## 2020-12-27 DIAGNOSIS — I10 Essential (primary) hypertension: Secondary | ICD-10-CM

## 2021-01-01 ENCOUNTER — Ambulatory Visit
Admission: RE | Admit: 2021-01-01 | Discharge: 2021-01-01 | Disposition: A | Discharge status: Home / Self Care | Payer: Self-pay | Source: Ambulatory Visit | Attending: Internal Medicine | Admitting: Internal Medicine

## 2021-01-01 ENCOUNTER — Other Ambulatory Visit: Payer: Self-pay | Admitting: Internal Medicine

## 2021-01-01 DIAGNOSIS — I739 Peripheral vascular disease, unspecified: Secondary | ICD-10-CM

## 2021-01-04 ENCOUNTER — Ambulatory Visit
Admission: RE | Admit: 2021-01-04 | Discharge: 2021-01-04 | Disposition: A | Discharge status: Home / Self Care | Payer: 59 | Source: Ambulatory Visit | Attending: Internal Medicine | Admitting: Internal Medicine

## 2021-01-04 ENCOUNTER — Encounter: Payer: Self-pay | Admitting: *Deleted

## 2021-01-04 DIAGNOSIS — I739 Peripheral vascular disease, unspecified: Secondary | ICD-10-CM

## 2021-01-04 HISTORY — PX: IR RADIOLOGIST EVAL & MGMT: IMG5224

## 2021-01-04 NOTE — Consult Note (Addendum)
Chief Complaint: Left Leg Claudication  Referring Physician(s): Pharr,Walter  History of Present Illness: Victor Curtis is a 50 y.o. male presenting to [facility] today as a scheduled consultation for left leg claudication, kindly referred by Dr. Shelia Media.   Victor Curtis is here today by himself.    He tells me that last December right after Christmas he started having left leg pain. The pain is present with short distance walking, such as when he is at work on a job site, particularly when he is walking up hills.  He works mainly as a Administrator.  He does tell me that the pain persists when walking, although seems to have improved from December somewhat.  In addition to the pain, he has tingling in his toes which resolves with rest.     He denies any prior wound, and denies any right leg symptoms.    He is treated for HTN, hypothyroidism.  He is currently taking 11m daily aspirin.  Currently he is not taking HMG Co-A reductase inhibitor.   He denies any prior MI or stroke.  He denies resting or exercise induced chest pain.  He has no SOB.    He is married with 2 college age children.    He is a former smoker, having quit about 10 years ago.    Prior surgery includes appendectomy, tonsillectomy.  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    Finally, he does complain of occasional dizziness when he is working.   He has completed a lower extremity dupex exam 12/07/20.   This is loaded in PACS under outside study.  Right ABI: 1.2.  Duplex shows normal right waveforms.  Left ABI: 0.8.  Duplex shows triphasic SFA segment, with clear change to monophasic signal in the distal popliteal artery and tibial arteries.    Past Medical History:  Diagnosis Date  . Chest pain   . Family history of heart disease   . Hypothyroidism     Past Surgical History:  Procedure Laterality Date  . 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  APPENDECTOMY  1983  . IR RADIOLOGIST EVAL & MGMT  01/04/2021    Allergies: Penicillins  Medications: Prior to Admission medications   Medication Sig Start Date End Date Taking? Authorizing Provider  levothyroxine (SYNTHROID) 200 MCG tablet TAKE 1 TABLET EVERY MORNING BEFORE BREAKFAST 08/12/20   KElayne Snare MD     Family History  Problem Relation Age of Onset  . Diabetes Mother   . Heart attack Mother   . Diabetes Father   . Heart attack Brother   . Heart failure Brother     Social History   Socioeconomic History  . Marital status: Married    Spouse name: Not on file  . Number of children: Not on file  . Years of education: Not on file  . Highest education level: Not on file  Occupational History  . Not on file  Tobacco Use  . Smoking status: Former Smoker    Quit date: 10/30/2011    Years since quitting: 9.1  . Smokeless tobacco: Never Used  Substance and Sexual Activity  . Alcohol use: No  . Drug use: Not on file  . Sexual activity: Not on file  Other Topics Concern  . Not on file  Social History Narrative  . Not on file   Social Determinants of Health   Financial Resource Strain: Not on file  Food Insecurity: Not on file  Transportation Needs: Not on file  Physical Activity: Not on file  Stress: Not on file  Social Connections: Not on file       Review of Systems: A 12 point ROS discussed and pertinent positives are indicated in the HPI above.  All other systems are negative.  Review of Systems  Vital Signs: BP (!) 159/98 (BP Location: Right Arm)   Pulse 99   SpO2 (!) 86%   Physical Exam General: 50 yo male appearing stated age.  Well-developed, well-nourished.  No distress. HEENT: Atraumatic, normocephalic.  Glasses. Conjugate gaze, extra-ocular motor intact. No scleral icterus or scleral injection.  Neck: Symmetric with no goiter enlargement.  Chest/Lungs:  Symmetric chest with inspiration/expiration.  No labored breathing.  Clear to  auscultation with no wheezes, rhonchi, or rales.  Heart:  RRR, with no third heart sounds appreciated. No JVD appreciated.  Abdomen:  Obese, Soft, NT/ND, with + bowel sounds.   Genito-urinary: Deferred Neurologic: Alert & Oriented to person, place, and time.   Normal affect and insight.  Appropriate questions.  Moving all 4 extremities with gross sensory intact.  Pulse Exam:  No bruit appreciated.    Doppler positive left AT & PT.  Extremities: No wound.  Capillary refill prompt.    Mallampati Score:     Imaging: IR Radiologist Eval & Mgmt  Result Date: 01/04/2021 Please refer to notes tab for details about interventional procedure. (Op Note)  [place] OUTSIDE FILMS SPINE  Result Date: 01/01/2021 This examination belongs to an outside facility and is stored here for comparison purposes only.  Contact the originating outside institution for any associated report or interpretation.   Labs:  CBC: No results for input(s): WBC, HGB, HCT, PLT in the last 8760 hours.  COAGS: No results for input(s): INR, APTT in the last 8760 hours.  BMP: No results for input(s): NA, K, CL, CO2, GLUCOSE, BUN, CALCIUM, CREATININE, GFRNONAA, GFRAA in the last 8760 hours.  Invalid input(s): CMP  LIVER FUNCTION TESTS: No results for input(s): BILITOT, AST, ALT, ALKPHOS, PROT, ALBUMIN in the last 8760 hours.  TUMOR MARKERS: No results for input(s): AFPTM, CEA, CA199, CHROMGRNA in the last 8760 hours.  Assessment and Plan:  Assessment:  Victor Curtis is a 50 yo male presenting with left sided life-style limiting symptoms of short distance claudication, Rutherford 3 class symptoms   Non-invasive lower extremity exam shows normal right ABI, mild range arterial occlusive disease on the left with ABI of 0.8.  I suspect this would decrease after exercise exam given the monophasic waveforms of the distal popliteal artery.     We had a discussion with Victor Curtis regarding anatomy, pathology/pathophysiology,  natural history, and prognosis of PAD/CLI.  Informed consent regarding treatment strategies was performed which would possibly include medical management, walking program, surgical strategy, and/or endovascular options, with risk/benefit discussion.  The indications for treatment supported by updated guidelines1, 2 were discussed.  Endovascular options specifically were discussed.  Regarding endovascular options, specific risks discussed include: bleeding, infection, contrast reaction, renal injury/nephropathy, arterial injury/dissection, need for additional procedure/surgery, worsening symptoms/tissue including limb loss, cardiopulmonary collapse, death.    Regarding medical management, maximal medical therapy for reduction of risk factors is indicated as recommended by updated AHA guidelines1.  This includes anti-platelet medication, tight blood glucose control to a HbA1c < 7, tight blood pressure control, maximum-dose HMG-CoA reductase inhibitor, and smoking cessation.  He has already quit smoking, and I congratulated him.   Annual flu vaccination  is also recommended, with Class 1 recommendation1.   Finally, he had a question regarding in my opinion he "needed" this performed.  I did let him know that the risk of progressing from Rutherford 3 class symptoms to a critical limb ischemia situation is about 1-2%, and so I let him know that my impression is that this procedure should only be pursued if he feels that he wants to improve his symptoms and accept the risk profile of endovascular therapy.  He understands.    Ultimately, he wants to discuss with his wife and decide.     Plan: - He is going to discuss today's visit with his wife and decide if he would like to pursue aorto-peripheral angiogram and possible intervention. - We will order carotid duplex exam, given his reported history of dizziness and his diagnosis of LE PAD. This could be performed in [place], if he would like at his  convenience.  - If he would like to manage conservatively for now, we will schedule an office visit only in 6 months to assess his symptoms.  After that, given his diagnosis, we would recommend annual ABI screening study, as indicated by guidelines.  -Recommend maximal medical therapy for cardiovascular risk reduction, including anti-platelet therapy. - AHA would recommend HMG-CoA reductase therapy for the plieotrophic beneficial effects.  I asked him to discuss with his PCP.  -Annual flu vaccination is recommended in the setting of known PAD, in the absence of contra-indications.    ___________________________________________________________________   1Morley Kos MD, et al. 2016 AHA/ACC Guideline on the Management of Patients With Lower Extremity Peripheral Artery Disease: Executive Summary: A Report of the American College of Cardiology/American Heart Association Task Force on Clinical Practice Guidelines. J Am Coll Cardiol. 2017 Mar 21;69(11):1465-1508. doi: 10.1016/j.jacc.2016.11.008.   2 - Norgren L, et al. TASC II Working Group. Inter-society consensus for the management of peripheral arterial disease. Int Tressia Miners. 2007 Jun;26(2):81-157. Review. PubMed PMID: 82707867  3 - Hingorani A, et al. The management of diabetic foot: A clinical practice guideline by the Society for Vascular Surgery in collaboration with the [place] and the Society  for Vascular Medicine. J Vasc Surg. 2016 Feb;63(2 Suppl):3S-21S. doi: 10.1016/j.jvs.2015.10.003. PubMed PMID: 54492010.  4 - Corinna Gab, Saab FA, Luberta Mutter, Grant Ruts, Ewell Poe, Driver VR, [place], Lookstein R, van den Baldemar Lenis, Jaff Victor, Guadalupe Dawn, Henao S, AlMahameed A, Katzen B. Digital Subtraction Angiography Prior to an Amputation for Critical Limb Ischemia (CLI): An Expert Recommendation Statement From the CLI Global Society to Optimize Limb Salvage. J Endovasc Ther. 2020 Aug;27(4):540-546. doi:  10.1177/1526602820928590. Epub 2020 May 29. PMID: 07121975.    Thank you for this interesting consult.  I greatly enjoyed meeting Victor Curtis and look forward to participating in their care.  A copy of this report was sent to the requesting provider on this date.  Electronically Signed: Corrie Mckusick 01/04/2021, 11:49 AM   I spent a total of  60 Minutes   in face to face in clinical consultation, greater than 50% of which was counseling/coordinating care for left leg claudication, Rutherford 3 class PAD, possible angiogram and intervention

## 2021-01-26 ENCOUNTER — Ambulatory Visit: Payer: 59 | Admitting: Family Medicine

## 2021-02-22 ENCOUNTER — Other Ambulatory Visit: Payer: 59

## 2021-03-16 ENCOUNTER — Ambulatory Visit
Admission: RE | Admit: 2021-03-16 | Discharge: 2021-03-16 | Disposition: A | Discharge status: Home / Self Care | Payer: No Typology Code available for payment source | Source: Ambulatory Visit | Attending: Internal Medicine | Admitting: Internal Medicine

## 2021-03-16 DIAGNOSIS — I10 Essential (primary) hypertension: Secondary | ICD-10-CM

## 2021-04-23 ENCOUNTER — Other Ambulatory Visit: Payer: Self-pay

## 2021-04-23 DIAGNOSIS — E890 Postprocedural hypothyroidism: Secondary | ICD-10-CM

## 2021-04-23 MED ORDER — LEVOTHYROXINE SODIUM 200 MCG PO TABS: ORAL_TABLET | ORAL | 3 refills | Status: DC

## 2021-06-18 ENCOUNTER — Other Ambulatory Visit: Payer: Self-pay | Admitting: Interventional Radiology

## 2021-06-18 DIAGNOSIS — I739 Peripheral vascular disease, unspecified: Secondary | ICD-10-CM

## 2021-07-12 ENCOUNTER — Other Ambulatory Visit: Payer: 59

## 2021-07-20 ENCOUNTER — Other Ambulatory Visit: Payer: Self-pay

## 2021-07-20 ENCOUNTER — Ambulatory Visit
Admission: RE | Admit: 2021-07-20 | Discharge: 2021-07-20 | Disposition: A | Discharge status: Home / Self Care | Payer: 59 | Source: Ambulatory Visit | Attending: Interventional Radiology | Admitting: Interventional Radiology

## 2021-07-20 DIAGNOSIS — I739 Peripheral vascular disease, unspecified: Secondary | ICD-10-CM

## 2021-07-26 ENCOUNTER — Ambulatory Visit
Admission: RE | Admit: 2021-07-26 | Discharge: 2021-07-26 | Disposition: A | Discharge status: Home / Self Care | Payer: 59 | Source: Ambulatory Visit | Attending: Interventional Radiology | Admitting: Interventional Radiology

## 2021-07-26 ENCOUNTER — Other Ambulatory Visit: Payer: Self-pay

## 2021-07-26 ENCOUNTER — Encounter: Payer: Self-pay | Admitting: *Deleted

## 2021-07-26 DIAGNOSIS — I739 Peripheral vascular disease, unspecified: Secondary | ICD-10-CM

## 2021-07-26 HISTORY — PX: IR RADIOLOGIST EVAL & MGMT: IMG5224

## 2021-07-26 NOTE — Progress Notes (Signed)
Chief Complaint: Follow up PAD  Referring Physician(s): Pharr,Walter   History of Present Illness: Victor Curtis is a 50 y.o. male presenting to [facility] today as a scheduled follow up    A telemedicine visit was performed with Victor Victor Curtis.  We confirmed his identity with 2 personal identifiers.      He has known left lower extremity claudication, secondary to PAD diagnosis.  We met him about 6 months ago, 01/04/21.     We discussed potentially treating him with angiogram/intervention, but previously he was not yet maximized medically.    He tells me that now he is on additional medication including for HTN, 46m daily aspirin, and has since started taking HMG Co-A reductase inhibitor.    He denies any changes, and in fact is telling me that his symptoms have improved.    His previous non-invasive was completed  12/07/20.   This is loaded in PACS under outside study.  Right ABI: 1.2.  Duplex shows normal right waveforms.  Left ABI: 0.8.  Duplex shows triphasic SFA segment, with clear change to monophasic signal in the distal popliteal artery and tibial arteries.   Updated exam was 07/20/21: Right ABI: 1.07  Left ABI: 0.93.  Waveforms are stable.  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 Authorizing Provider  levothyroxine (SYNTHROID) 200 MCG tablet TAKE 1 TABLET EVERY MORNING BEFORE BREAKFAST 04/23/21   KElayne Snare MD     Family History  Problem Relation Age of Onset   Diabetes Mother    Heart attack Mother    Diabetes Father    Heart attack Brother    Heart failure Brother     Social History    Socioeconomic History   Marital status: Married    Spouse name: Not on file   Number of children: Not on file   Years of education: Not on file   Highest education level: Not on file  Occupational History   Not on file  Tobacco Use   Smoking status: Former    Types: Cigarettes    Quit date: 10/30/2011    Years since quitting: 9.7   Smokeless tobacco: Never  Substance and Sexual Activity   Alcohol use: No   Drug use: Not on file   Sexual activity: Not on file  Other Topics Concern   Not on file  Social History Narrative   Not on file   Social Determinants of Health   Financial Resource Strain: Not on file  Food Insecurity: Not on file  Transportation Needs: Not on file  Physical Activity: Not on file  Stress: Not on file  Social Connections: Not on file      Review of Systems  Review of Systems: A 12 point ROS discussed and pertinent positives are indicated in the HPI above.  All other systems are negative.  Physical Exam No direct physical exam was performed (except for noted visual exam findings with Video Visits).    Vital Signs: There were no vitals taken for this visit.  Imaging: [place]IAL SEG MULTIPLE LE (ABI, SEGMENTAL  PRESSURES, PVR'S)  Result Date: 07/23/2021 CLINICAL DATA:  50 year old male with a history of claudication EXAM: NONINVASIVE PHYSIOLOGIC VASCULAR STUDY OF BILATERAL LOWER EXTREMITIES TECHNIQUE: Evaluation of both lower extremities was performed at rest, including calculation of ankle-brachial indices, multiple segmental pressure evaluation, segmental Doppler and segmental pulse volume recording. COMPARISON:  None. FINDINGS: Right: Resting ankle brachial index:  1.07 Segmental blood pressure: Upper extremities pressures are symmetric. Appropriate increased to the thigh. Doppler: Segmental Doppler demonstrates triphasic waveforms on the right. Pulse volume recording: Segmental PVR maintained throughout Left: Resting ankle brachial index: 0.93  Segmental blood pressure: Upper extremity pressures are symmetric. Appropriate increased to the thigh. Doppler: Segmental Doppler demonstrates multiphasic waveforms throughout Pulse volume recording: Segmental PVR maintained to the calf. Deterioration at the ankle with near flat line in the left great toe Additional: IMPRESSION: Resting ABI of the bilateral lower extremity within normal limits, borderline on the left. Segmental exam demonstrates no significant right-sided arterial occlusive disease, with early left-sided distal femoropopliteal disease on the left. Signed, Dulcy Fanny. Dellia Nims, RPVI [facility] Electronically Signed   By: Corrie Mckusick D.O.   On: 07/23/2021 08:15    Labs:  CBC: No results for input(s): WBC, HGB, HCT, PLT in the last 8760 hours.  COAGS: No results for input(s): INR, APTT in the last 8760 hours.  BMP: No results for input(s): NA, K, CL, CO2, GLUCOSE, BUN, CALCIUM, CREATININE, GFRNONAA, GFRAA in the last 8760 hours.  Invalid input(s): CMP  LIVER FUNCTION TESTS: No results for input(s): BILITOT, AST, ALT, ALKPHOS, PROT, ALBUMIN in the last 8760 hours.  TUMOR MARKERS: No results for input(s): AFPTM, CEA, CA199, CHROMGRNA in the last 8760 hours.  Assessment and Plan:  Assessment:  Victor Curtis is a 50yo male presenting as follow up to [facility], with left sided Rutherford 3 class symptoms of PAD.       Non-invasive lower extremity exam is slightly improved on the left, previously 0.8, now 0.93, and he endorses slightly better symptoms.     At this time, I told him that he should continue his medical care and that he likely will be fine with no treatment at this time.  He agrees, and is not motivated to have any treatment.    Regarding medical management, maximal medical therapy for reduction of risk factors is indicated as recommended by updated AHA guidelines1.  This includes anti-platelet medication, tight  blood glucose control to a HbA1c < 7, tight blood pressure control, maximum-dose HMG-CoA reductase inhibitor.  Since our last visit, he has started cholesterol medication.   Annual flu vaccination is also recommended, with Class 1 recommendation1.   Because of the diagnosis of PAD, it is indicated for him to have annual ABI, though I did let him know that it would be reasonable to extend out that time-frame to 2 years or beyond if he remains asymptomatic.   Plan: - We are happy to see Victor. Curtis back on as needed basis if his symptoms worsen, and at this point he does not feel the need to have an annual scheduled appointment.  -Continue maximal medical therapy for cardiovascular risk reduction, including anti-platelet therapy.   ___________________________________________________________________   1Morley Kos MD, et al. 2016 AHA/ACC Guideline on the Management of Patients With Lower Extremity Peripheral Artery Disease: Executive Summary: A Report of the American College of Cardiology/American Heart Association Task Force on Clinical Practice Guidelines. J Am Coll Cardiol. 2017 Mar 21;69(11):1465-1508. doi: 10.1016/j.jacc.2016.11.008.   2 -  Norgren L, et al. TASC II Working Group. Inter-society consensus for the management of peripheral arterial disease. Int Tressia Miners. 2007 Jun;26(2):81-157. Review. PubMed PMID: 55374827  3 - Hingorani A, et al. The management of diabetic foot: A clinical practice guideline by the Society for Vascular Surgery in collaboration with the [place] and the Society  for Vascular Medicine. J Vasc Surg. 2016 Feb;63(2 Suppl):3S-21S. doi: 10.1016/j.jvs.2015.10.003. PubMed PMID: 07867544.  4 - Corinna Gab, Saab FA, Luberta Mutter, Grant Ruts, Ewell Poe, Driver VR, Stanton, Lookstein R, van den Baldemar Lenis, Jaff Victor, Guadalupe Dawn, Henao S, AlMahameed A, Katzen B. Digital Subtraction Angiography Prior to an Amputation for Critical Limb Ischemia (CLI): An  Expert Recommendation Statement From the CLI Global Society to Optimize Limb Salvage. J Endovasc Ther. 2020 Aug;27(4):540-546. doi: 10.1177/1526602820928590. Epub 2020 May 29. PMID: 92010071.    Thank you for this interesting consult.  I greatly enjoyed meeting Victor Curtis and look forward to participating in their care.  A copy of this report was sent to the requesting provider on this date.  Electronically Signed: Corrie Mckusick 07/26/2021, 2:51 PM   I spent a total of    15 Minutes in remote  clinical consultation, greater than 50% of which was counseling/coordinating care for rutherford 3 class symptoms of left LE claudication.    Visit type: Audio only (telephone). Audio (no video) only due to patient's lack of internet/smartphone capability. Alternative for in-person consultation at [facility], [address], [place], [place]. This visit type was conducted due to national recommendations for restrictions regarding the COVID-19 Pandemic (e.g. social distancing).  This format is felt to be most appropriate for this patient at this time.  All issues noted in this document were discussed and addressed.

## 2021-08-20 LAB — COLOGUARD: COLOGUARD: NEGATIVE

## 2022-05-29 ENCOUNTER — Telehealth: Payer: Self-pay

## 2022-05-29 DIAGNOSIS — E890 Postprocedural hypothyroidism: Secondary | ICD-10-CM

## 2022-05-29 NOTE — Telephone Encounter (Signed)
Patient called in for refill of medication (levothyroxine) has not been seen in almost 3 years. Tried calling patient but no answer. Left vm

## 2022-05-30 MED ORDER — LEVOTHYROXINE SODIUM 200 MCG PO TABS: ORAL_TABLET | ORAL | 0 refills | Status: DC

## 2022-05-30 NOTE — Telephone Encounter (Signed)
Patient called and scheduled appointment for 06/04/2022 and labs for 05/31/2022.  Patient has moved to [place], [place] so needs his prescription for Levothyroxine sent in to [facility] [address] in [place], [place]. Phone # is [phone].

## 2022-05-30 NOTE — Telephone Encounter (Signed)
Rx sent 

## 2022-05-30 NOTE — Addendum Note (Signed)
Addended by: Eliseo Squires on: 05/30/2022 03:37 PM   Modules accepted: Orders

## 2022-05-31 ENCOUNTER — Ambulatory Visit: Payer: 59

## 2022-06-04 ENCOUNTER — Ambulatory Visit: Payer: 59 | Admitting: Endocrinology

## 2022-08-23 IMAGING — CT CT CARDIAC CORONARY ARTERY CALCIUM SCORE
3 series · 14 of 20 positions shown, 16 images · non-contrast
Comparison: None

CLINICAL DATA: 49-year-old white male.  Hypercholesterolemia.

EXAM:
CT CARDIAC CORONARY ARTERY CALCIUM SCORE
TECHNIQUE: Non-contrast imaging through the heart was performed using
prospective ECG gating. Image post processing was performed on an
independent workstation, allowing for quantitative analysis of the
heart and coronary arteries. Note that this exam targets the heart
and the chest was not imaged in its entirety.

[Series 2: calcium scoring 2.00 qr36 bestdiast 69% hrt calciu · axial · 0.39mm/px · z∈[+1755,+1803]mm · 4 of 42 slices shown]
[im 9/42  vessel]
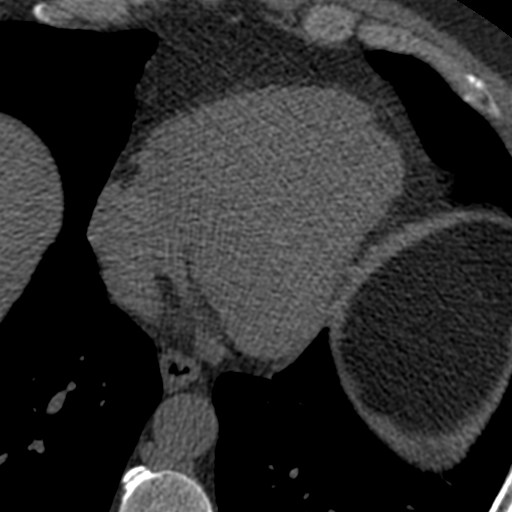
[im 17/42  vessel]
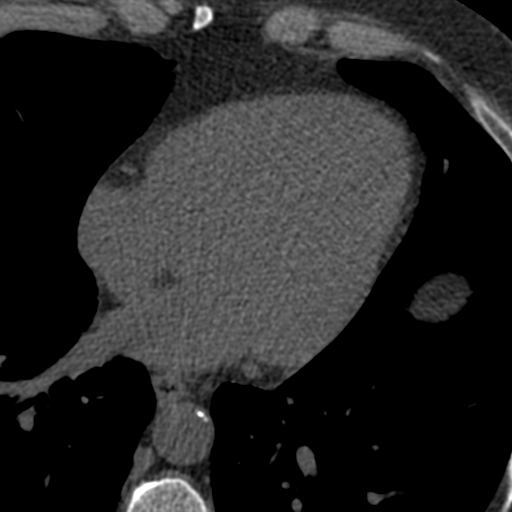
[im 25/42  vessel]
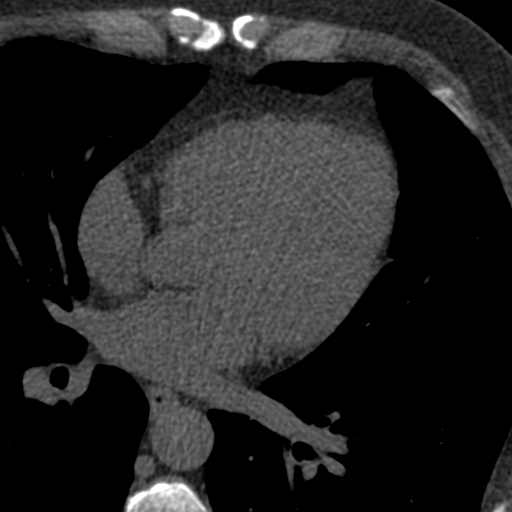
[im 33/42  vessel]
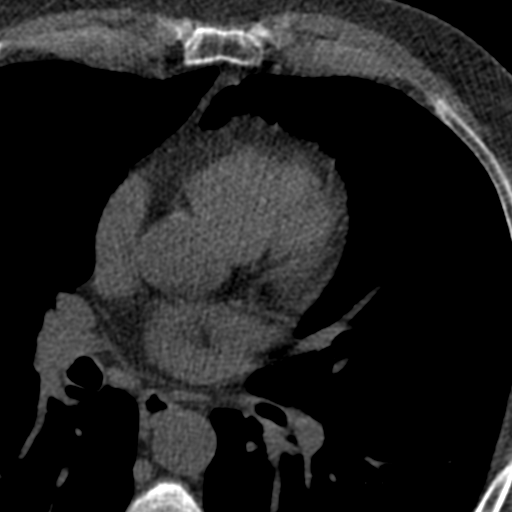

[Series 3: calcium scoring 2.00 br40 bestdiast 69% axial · axial · 0.72mm/px · z∈[+1751,+1807]mm · 5 of 42 slices shown, 7 images]
[im 7/42  vessel]
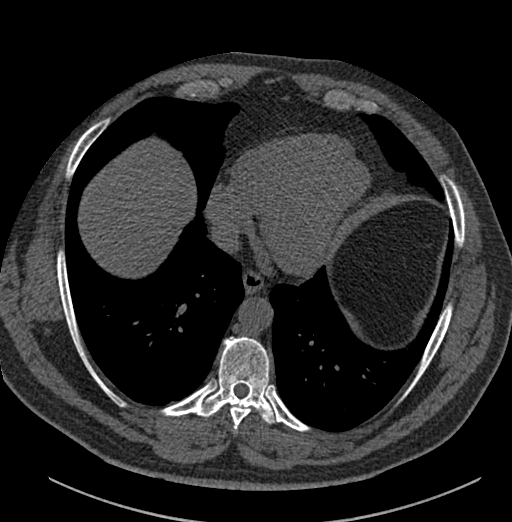
[im 7/42  lung]
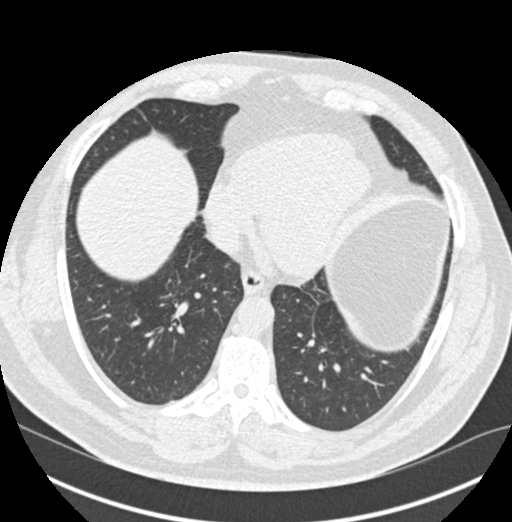
[im 14/42  vessel]
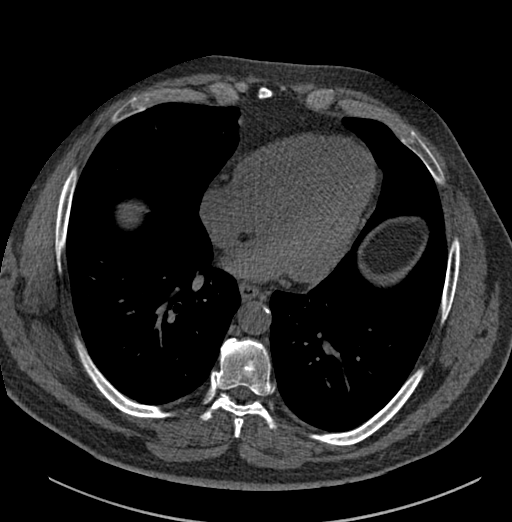
[im 21/42  vessel]
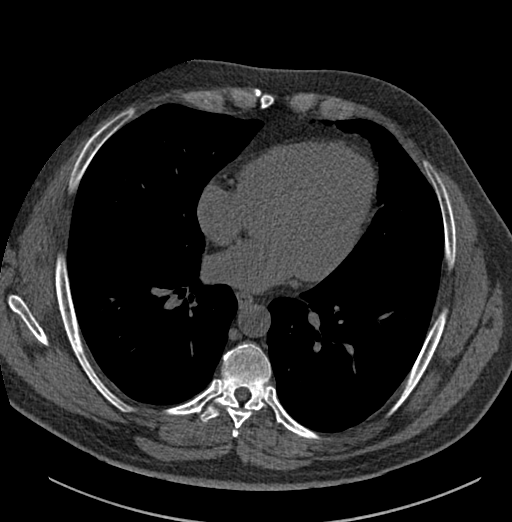
[im 28/42  vessel]
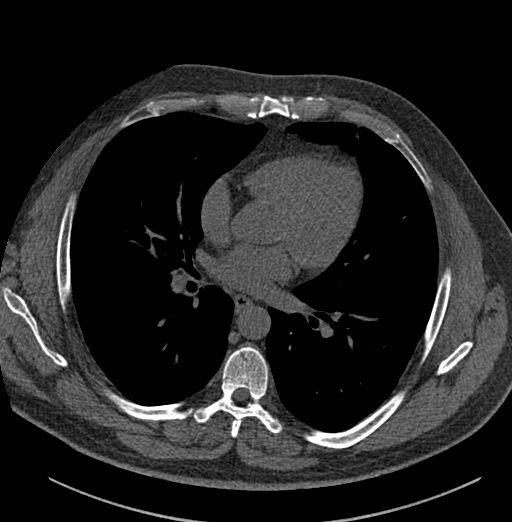
[im 35/42  vessel]
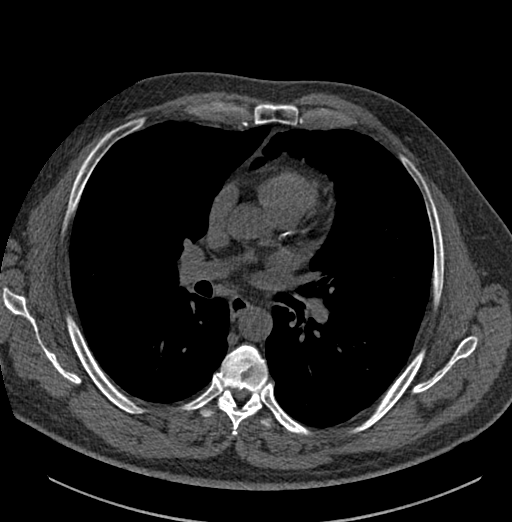
[im 35/42  lung]
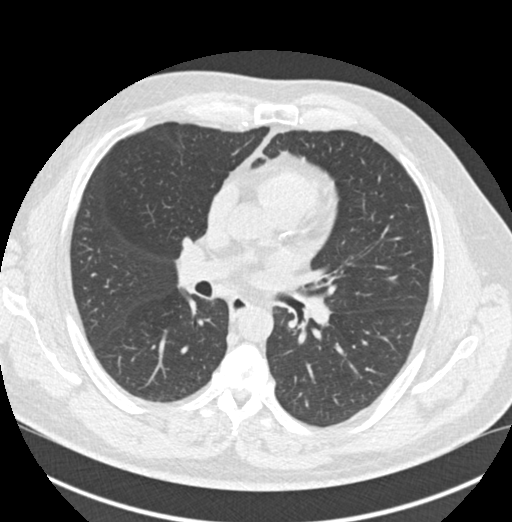

[Series 9: calcium scoring 2.00 br60 bestdiast 69% lungs · axial · 0.72mm/px · z∈[+1751,+1807]mm · 5 of 42 slices shown]
[im 7/42  vessel]
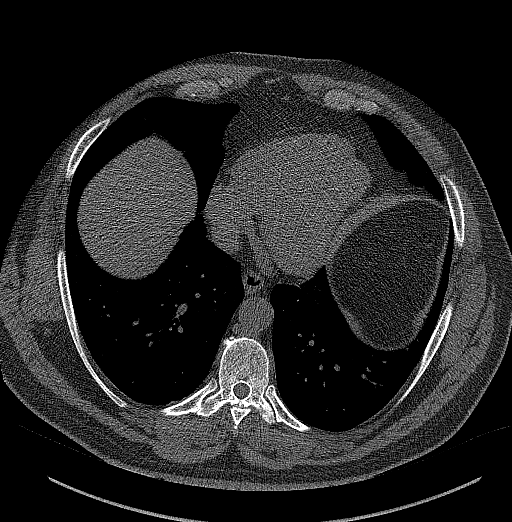
[im 14/42  vessel]
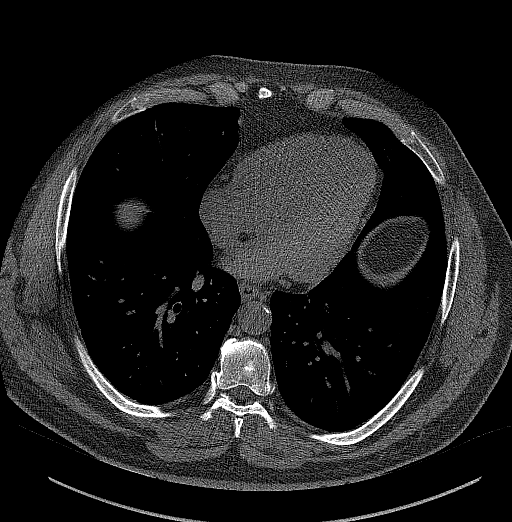
[im 21/42  vessel]
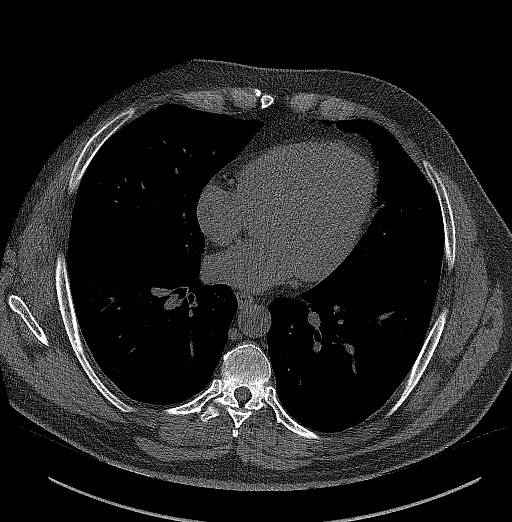
[im 28/42  vessel]
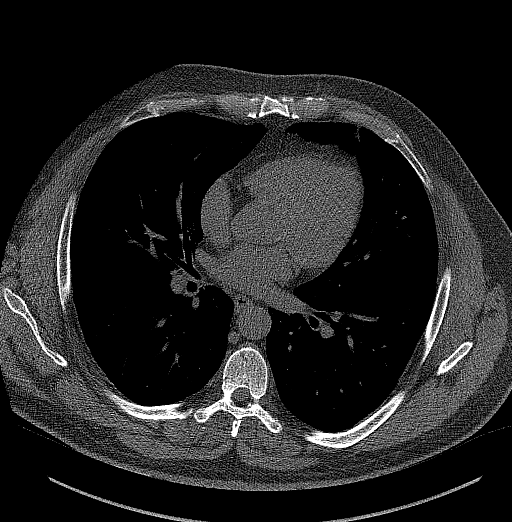
[im 35/42  vessel]
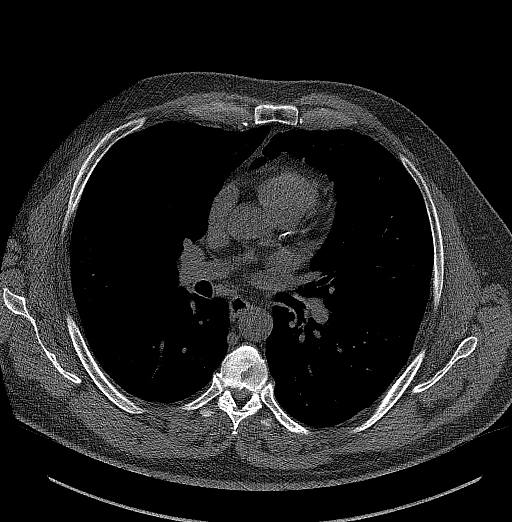

[14 of 20 positions shown; findings below may reference images not displayed]

FINDINGS: Technical quality: Good

CORONARY CALCIUM SCORES:

Left Main: No coronary artery calcification

LAD: 37

LCx: No coronary calcification

RCA: No coronary calcification

CORONARY CALCIUM

Total Agatston Score: 37

[HOSPITAL] percentile: 84

Ascending aorta (normal <  40 mm): 24 mm

EXTRACARDIAC FINDINGS:

Limited view of the lung parenchyma demonstrates no suspicious
nodularity. Airways are normal.

Limited view of the mediastinum demonstrates no adenopathy.
Esophagus normal.

Limited view of the upper abdomen is unremarkable.

Limited view of the skeleton and chest wall is unremarkable.
IMPRESSION: 1. LAD coronary artery calcification.

2. Total Agatston Score: 37

3. MESA age and sex matched database percentile: 84a

## 2022-09-13 ENCOUNTER — Other Ambulatory Visit: Payer: Self-pay

## 2022-09-13 DIAGNOSIS — E890 Postprocedural hypothyroidism: Secondary | ICD-10-CM

## 2022-09-13 MED ORDER — LEVOTHYROXINE SODIUM 200 MCG PO TABS: ORAL_TABLET | ORAL | 0 refills | Status: DC

## 2022-10-01 ENCOUNTER — Other Ambulatory Visit: Payer: Self-pay | Admitting: Endocrinology

## 2022-10-01 ENCOUNTER — Other Ambulatory Visit (INDEPENDENT_AMBULATORY_CARE_PROVIDER_SITE_OTHER): Payer: 59

## 2022-10-01 DIAGNOSIS — E890 Postprocedural hypothyroidism: Secondary | ICD-10-CM

## 2022-10-01 LAB — TSH: TSH: 2.87 u[IU]/mL (ref 0.35–5.50)

## 2022-10-01 LAB — T4, FREE: Free T4: 0.71 ng/dL (ref 0.60–1.60)

## 2022-10-02 ENCOUNTER — Other Ambulatory Visit: Payer: 59

## 2022-10-04 ENCOUNTER — Encounter: Payer: Self-pay | Admitting: Endocrinology

## 2022-10-04 ENCOUNTER — Ambulatory Visit: Payer: 59 | Admitting: Endocrinology

## 2022-10-04 VITALS — BP 138/98 | HR 81 | Ht 71.0 in | Wt 270.0 lb

## 2022-10-04 DIAGNOSIS — E890 Postprocedural hypothyroidism: Secondary | ICD-10-CM | POA: Diagnosis not present

## 2022-10-04 DIAGNOSIS — I10 Essential (primary) hypertension: Secondary | ICD-10-CM

## 2022-10-04 MED ORDER — LEVOTHYROXINE SODIUM 200 MCG PO TABS: ORAL_TABLET | ORAL | 3 refills | Status: DC

## 2022-10-04 NOTE — Progress Notes (Signed)
Patient ID: Victor Curtis, male   DOB: 1970/11/24, 52 y.o.   MRN: [id number]    Reason for Appointment:  Hypothyroidism, followup visit   History of Present Illness:   The hypothyroidism was first diagnosed in 2010 after treatment of hyperthyroidism with I-131  The patient has been treated with generic levothyroxine and has been taking variable doses of levothyroxine in the past Previously the Synthroid dose was increased because of his TSH being 10.5 although he was asymptomatic Subsequently his TSH had been persistently low and her dose has been gradually reduced  He has been on SYNTHROID 200 g daily since 08/2014 Usually does not have any symptoms if he has abnormal TSH levels either when it is high or low.  No recent complaints of unusual fatigue His weight appears to be higher but he thinks this is from his boots and clothes  He is taking the thyroid supplement very regularly in the morning before breakfast without any other supplements at the same time Now taking generic for some time  Although he was scheduled to come back in late 2022 is now coming back after almost 3 years for follow-up  Labs as follows:   Lab Results  Component Value Date   TSH 2.87 10/01/2022   TSH 1.74 12/10/2019   TSH 1.38 06/05/2018   FREET4 0.71 10/01/2022   FREET4 0.96 12/10/2019   FREET4 0.95 06/05/2018      Allergies as of 10/04/2022       Reactions   Penicillins         Medication List        Accurate as of October 04, 2022 10:24 AM. If you have any questions, ask your nurse or doctor.          aspirin EC 81 MG tablet Take 81 mg by mouth daily. Swallow whole.   atorvastatin 20 MG tablet Commonly known as: LIPITOR Take 20 mg by mouth daily.   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 His smoking use included cigarettes. He has never used smokeless tobacco. He reports that he does not drink alcohol. No history on file for drug use.  REVIEW Of SYSTEMS:  Weight has gone up from prior visits  Wt Readings from Last 3 Encounters:  10/04/22 270 lb (122.5 kg)  12/01/20 254 lb 12.8 oz (115.6 kg)  12/10/19 257 lb 3.2 oz (116.7 kg)    His PCP did try him on blood pressure medication by the stop it because of dizziness and has not gone back  BP Readings from Last 3 Encounters:  10/04/22 (!) 138/98  01/04/21 Marland Kitchen)  159/98  12/01/20 120/82     Examination:   BP (!) 138/98   Pulse 81   Ht 5\' 11"  (1.803 m)   Wt 270 lb (122.5 kg)   SpO2 93%   BMI 37.66 kg/m    Minimal swelling of the eyelids  No edema of hands or ankles     Assessment   Hypothyroidism, 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

## 2022-10-11 ENCOUNTER — Other Ambulatory Visit: Payer: 59

## 2023-05-16 ENCOUNTER — Other Ambulatory Visit: Payer: Self-pay

## 2023-05-16 DIAGNOSIS — E119 Type 2 diabetes mellitus without complications: Secondary | ICD-10-CM

## 2023-05-20 ENCOUNTER — Telehealth: Payer: Self-pay | Admitting: Endocrinology

## 2023-05-20 ENCOUNTER — Other Ambulatory Visit: Payer: Self-pay

## 2023-05-20 DIAGNOSIS — E890 Postprocedural hypothyroidism: Secondary | ICD-10-CM

## 2023-05-20 MED ORDER — LEVOTHYROXINE SODIUM 200 MCG PO TABS: ORAL_TABLET | ORAL | 3 refills | Status: DC

## 2023-05-20 NOTE — Telephone Encounter (Signed)
Levothyroxine has been sent in to [facility]

## 2023-05-20 NOTE — Telephone Encounter (Signed)
MEDICATION:  levothyroxine levothyroxine (SYNTHROID) 200 MCG tablet  PHARMACY:  [facility] in [place] Phone: [phone] HAS THE PATIENT CONTACTED THEIR PHARMACY?  Yes  IS THIS A 90 DAY SUPPLY : Yes  IS PATIENT OUT OF MEDICATION: No  IF NOT; HOW MUCH IS LEFT: 7 days worth  LAST APPOINTMENT DATE: @1 /08/2023  NEXT APPOINTMENT DATE:@Visit  date not found  DO WE HAVE YOUR PERMISSION TO LEAVE A DETAILED MESSAGE?:Yes  OTHER COMMENTS: Patient is using new pharmacy   **Let patient know to contact pharmacy at the end of the day to make sure medication is ready. **  ** Please notify patient to allow 48-72 hours to process**  **Encourage patient to contact the pharmacy for refills or they can request refills through [facility]**

## 2023-07-11 ENCOUNTER — Other Ambulatory Visit: Payer: Self-pay

## 2023-07-11 ENCOUNTER — Telehealth: Payer: Self-pay | Admitting: Endocrinology

## 2023-07-11 DIAGNOSIS — E890 Postprocedural hypothyroidism: Secondary | ICD-10-CM

## 2023-07-11 MED ORDER — LEVOTHYROXINE SODIUM 200 MCG PO TABS: ORAL_TABLET | ORAL | 3 refills | Status: DC

## 2023-07-11 NOTE — Telephone Encounter (Signed)
Levothyroxine refill request sent

## 2023-07-11 NOTE — Telephone Encounter (Signed)
Patient advising he only has enough Levothyroxine 200mg   for 3 days. Please call rx into [facility]- [place]. Advise patient when done

## 2023-07-18 ENCOUNTER — Encounter: Payer: Self-pay | Admitting: Endocrinology

## 2023-07-18 ENCOUNTER — Ambulatory Visit: Payer: 59 | Admitting: Endocrinology

## 2023-07-18 ENCOUNTER — Other Ambulatory Visit: Payer: Self-pay

## 2023-07-18 DIAGNOSIS — E890 Postprocedural hypothyroidism: Secondary | ICD-10-CM | POA: Diagnosis not present

## 2023-07-18 MED ORDER — LEVOTHYROXINE SODIUM 200 MCG PO TABS: 200.0000 ug | ORAL_TABLET | Freq: Every day | ORAL | 4 refills | Status: AC

## 2023-07-18 MED ORDER — LEVOTHYROXINE SODIUM 200 MCG PO TABS: 200.0000 ug | ORAL_TABLET | Freq: Every day | ORAL | 1 refills | Status: DC

## 2023-07-18 NOTE — Progress Notes (Addendum)
Outpatient Endocrinology Note [place] Catharina Pica, MD  07/18/23  Patient's Name: Victor Curtis    DOB: 11-17-1970    MRN: [id number]  REASON OF VISIT: Follow-up for hypothyroidism  PCP: Merri Brunette, MD  HISTORY OF PRESENT ILLNESS:   Victor Curtis is a 52 y.o. old male with past medical history as listed below is presented for a follow up of postablative hypothyroidism.  Patient was last time seen by Dr. Lucianne Muss in January 2024.  Pertinent Thyroid History: Patient was first diagnosed with hypothyroidism in 2010 after treatment for hypothyroidism with radioactive iodine I-131.  He has been on thyroid hormone replacement/ levothyroxine /Synthroid since the diagnosis.  Dose has been adjusted multiple times in the past.  He has been on a stable dose of Synthroid 200 mcg daily since December 2015.   Interval history 07/18/23 Patient has been taking levothyroxine 200 mcg daily in the early morning, it is at least after 1 hour and reports compliance.  He has not been taking levothyroxine for 1 week due to he ran out and not able to refill.  Denies any new symptoms.  Overall feels good and normal energy.  Denies change in bowel habit.  No dry skin.  No change in voice.  Denies palpitation or heat intolerance.  REVIEW OF SYSTEMS:  As per history of present illness.   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"   APPENDECTOMY  51   IR RADIOLOGIST EVAL & MGMT  01/04/2021   IR RADIOLOGIST EVAL & MGMT  07/26/2021    ALLERGIES: Allergies  Allergen Reactions   Penicillins     FAMILY HISTORY:  Family History  Problem Relation Age of Onset   Diabetes Mother    Heart attack Mother    Diabetes Father    Heart attack Brother    Heart failure Brother     SOCIAL  HISTORY: Social History   Socioeconomic History   Marital status: Married    Spouse name: Not on file   Number of children: Not on file   Years of education: Not on file   Highest education level: Not on file  Occupational History   Not on file  Tobacco Use   Smoking status: Former    Current packs/day: 0.00    Types: Cigarettes    Quit date: 10/30/2011    Years since quitting: 11.7   Smokeless tobacco: Never  Substance and Sexual Activity   Alcohol use: No   Drug use: Not on file   Sexual activity: Not on file  Other Topics Concern   Not on file  Social History Narrative   Not on file   Social Determinants of Health   Financial Resource Strain: Not on file  Food Insecurity: Not on file  Transportation Needs: Not on file  Physical Activity: Not on file  Stress: Not on file  Social Connections: Not on file    MEDICATIONS:  Current Outpatient Medications  Medication Sig Dispense Refill   aspirin EC 81 MG tablet Take 81 mg by mouth daily. Swallow whole.     atorvastatin (LIPITOR) 20 MG tablet Take 20 mg by mouth daily.     levothyroxine (SYNTHROID) 200 MCG tablet Take 1 tablet (200 mcg total) by mouth daily  before breakfast. TAKE 1 TABLET EVERY MORNING BEFORE BREAKFAST 90 tablet 4   No current facility-administered medications for this visit.    PHYSICAL EXAM: Vitals:   07/18/23 0959  BP: 130/80  Pulse: 70  Resp: 20  SpO2: 98%  Weight: 266 lb 9.6 oz (120.9 kg)  Height: 5\' 11"  (1.803 m)   Body mass index is 37.18 kg/m.  Wt Readings from Last 3 Encounters:  07/18/23 266 lb 9.6 oz (120.9 kg)  10/04/22 270 lb (122.5 kg)  12/01/20 254 lb 12.8 oz (115.6 kg)     General: Well developed, well nourished male in no apparent distress.  HEENT: AT/[place], no external lesions. Hearing intact to the spoken word Eyes:  Conjunctiva clear and no icterus. Neck: Trachea midline, neck supple  Lungs: Clear to auscultation, no wheeze. Respirations not labored Heart: S1S2, Regular  in rate and rhythm. Abdomen: Soft, non tender, non distended, no masses, no striae Neurologic: Alert, oriented, normal speech, deep tendon biceps reflexes normal Extremities: No pedal pitting edema, no tremors of outstretched hands Skin: Warm, color good. Dry skin work related.   Psychiatric: Does not appear depressed or anxious  PERTINENT HISTORIC LABORATORY AND IMAGING STUDIES:  All pertinent laboratory results were reviewed. Please see HPI also for further details.   TSH  Date Value Ref Range Status  10/01/2022 2.87 0.35 - 5.50 uIU/mL Final  12/10/2019 1.74 0.35 - 4.50 uIU/mL Final  06/05/2018 1.38 0.35 - 4.50 uIU/mL Final     ASSESSMENT / PLAN  1. Hypothyroidism, postradioiodine therapy    -Patient has longstanding history of postablative hypothyroidism since 2010.  He has been on a stable dose of levothyroxine 200 mcg daily.  He ran out of the levothyroxine 1 week ago and has not been taking it.  Plan: -Renewed levothyroxine 200 mcg daily. -Will not do labs today as he has not been taking levothyroxine for the last 1 week.  He had normal thyroid function test in the past on current dose of levothyroxine, will resume the same. -Check TSH, free T4 in 2-3 months.   We discussed the medical need for compliance with levothyroxine therapy, that it is a hormone necessary for life, and that serious consequences may result from noncompliance. Discussed the proper method of levothyroxine administration: take on an empty stomach in the morning, with water, waiting thirty to sixty minutes before taking any other beverages or food. Also reviewed the need to take calcium or iron supplements or multivitamin (that may contain iron or calcium) at least 4 hours after levothyroxine administration.  Diagnoses and all orders for this visit:  Hypothyroidism, postradioiodine therapy -     levothyroxine (SYNTHROID) 200 MCG tablet; Take 1 tablet (200 mcg total) by mouth daily before breakfast. TAKE 1  TABLET EVERY MORNING BEFORE BREAKFAST -     T4, free; Future -     TSH; Future    DISPOSITION Follow up in clinic in 12 months suggested.  Lab visit in 2-3 months.  All questions answered and patient verbalized understanding of the plan.  [place] Markia Kyer, MD [facility] Endocrinology [facility] [address] [place], [address] [place], [place] [address] Phone # [phone]  At least part of this note was generated using voice recognition software. Inadvertent word errors may have occurred, which were not recognized during the proofreading process.

## 2023-07-18 NOTE — Patient Instructions (Signed)
Levothyroxine 200 mcg daily.   Lab for thyroid in January.

## 2023-09-10 ENCOUNTER — Other Ambulatory Visit: Payer: Self-pay

## 2023-09-10 DIAGNOSIS — E890 Postprocedural hypothyroidism: Secondary | ICD-10-CM

## 2023-09-12 ENCOUNTER — Other Ambulatory Visit: Payer: 59

## 2023-09-13 LAB — TSH: TSH: 1.17 m[IU]/L (ref 0.40–4.50)

## 2023-09-13 LAB — T4, FREE: Free T4: 1.2 ng/dL (ref 0.8–1.8)

## 2024-07-16 ENCOUNTER — Other Ambulatory Visit

## 2024-07-16 ENCOUNTER — Encounter: Payer: Self-pay | Admitting: Endocrinology

## 2024-07-16 ENCOUNTER — Ambulatory Visit: Payer: 59 | Admitting: Endocrinology

## 2024-07-16 VITALS — BP 134/80 | HR 77 | Resp 16 | Ht 71.0 in | Wt 277.6 lb

## 2024-07-16 DIAGNOSIS — E890 Postprocedural hypothyroidism: Secondary | ICD-10-CM | POA: Diagnosis not present

## 2024-07-16 LAB — T4, FREE: Free T4: 1.2 ng/dL (ref 0.8–1.8)

## 2024-07-16 LAB — TSH: TSH: 1.67 m[IU]/L (ref 0.40–4.50)

## 2024-07-16 NOTE — Progress Notes (Signed)
 Outpatient Endocrinology Note Victor Larae Caison, MD  07/16/24  Patient's Name: Victor Curtis    DOB: 06-12-1971    MRN: [id number]  REASON OF VISIT: Follow-up for hypothyroidism  PCP: Clarice Nottingham, MD  HISTORY OF PRESENT ILLNESS:   Victor Curtis is a 53 y.o. old male with past medical history as listed below is presented for a follow up of postablative hypothyroidism.    Pertinent Thyroid  History: Patient was first diagnosed with hypothyroidism in 2010 after treatment for hypothyroidism with radioactive iodine I-131.  He has been on thyroid  hormone replacement/ levothyroxine  /Synthroid  since the diagnosis.  Dose has been adjusted multiple times in the past.  He has been on a stable dose of Synthroid  200 mcg daily since December 2015.   Interval history Patient has been taking levothyroxine  200 mcg daily, taking in the early morning before breakfast and reports compliance.  Denies palpitation and heat intolerance.  Overall feeling normal energy.  No new complaints today.  REVIEW OF SYSTEMS:  As per history of present illness.   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   APPENDECTOMY  27   IR RADIOLOGIST EVAL & MGMT  01/04/2021   IR RADIOLOGIST EVAL & MGMT  07/26/2021    ALLERGIES: Allergies  Allergen Reactions   Penicillins     FAMILY HISTORY:  Family History  Problem Relation Age of Onset   Diabetes Mother    Heart attack Mother    Diabetes Father    Heart attack Brother    Heart failure Brother     SOCIAL HISTORY: Social History   Socioeconomic History   Marital status: Married    Spouse name: Not on file   Number of children: Not on file   Years of education: Not on file   Highest education level: Not on file  Occupational History    Not on file  Tobacco Use   Smoking status: Former    Current packs/day: 0.00    Types: Cigarettes    Quit date: 10/30/2011    Years since quitting: 12.7   Smokeless tobacco: Never  Substance and Sexual Activity   Alcohol use: No   Drug use: Not on file   Sexual activity: Not on file  Other Topics Concern   Not on file  Social History Narrative   Not on file   Social Drivers of Health   Financial Resource Strain: Not on file  Food Insecurity: Not on file  Transportation Needs: Not on file  Physical Activity: Not on file  Stress: Not on file  Social Connections: Not on file    MEDICATIONS:  Current Outpatient Medications  Medication Sig Dispense Refill   aspirin  EC 81 MG tablet Take 81 mg by mouth daily. Swallow whole.     atorvastatin (LIPITOR) 20 MG tablet Take 20 mg by mouth daily.     levothyroxine  (SYNTHROID ) 200 MCG tablet Take 1 tablet (200 mcg total) by mouth daily before breakfast. TAKE 1 TABLET EVERY MORNING BEFORE BREAKFAST 90 tablet 4   No current facility-administered medications for this visit.    PHYSICAL EXAM: Vitals:   07/16/24 0806  BP: 134/80  Pulse: 77  Resp: 16  SpO2: 99%  Weight: 277 lb 9.6 oz (125.9 kg)  Height: 5' 11 (1.803 m)    Body mass index is 38.72 kg/m.  Wt Readings from Last 3 Encounters:  07/16/24 277 lb 9.6 oz (125.9 kg)  07/18/23 266 lb 9.6 oz (120.9 kg)  10/04/22 270 lb (122.5 kg)     General: Well developed, well nourished male in no apparent distress.  HEENT: AT/[place], no external lesions. Hearing intact to the spoken word Eyes:  Conjunctiva clear and no icterus. Neck: Trachea midline, neck supple  Abdomen: Soft, non tender, non distended, no masses, no striae Neurologic: Alert, oriented, normal speech, deep tendon biceps reflexes normal Extremities: No pedal pitting edema, no tremors of outstretched hands Skin: Warm, color good. Dry skin work related.   Psychiatric: Does not appear depressed or anxious  PERTINENT  HISTORIC LABORATORY AND IMAGING STUDIES:  All pertinent laboratory results were reviewed. Please see HPI also for further details.   TSH  Date Value Ref Range Status  09/12/2023 1.17 0.40 - 4.50 mIU/L Final  10/01/2022 2.87 0.35 - 5.50 uIU/mL Final  12/10/2019 1.74 0.35 - 4.50 uIU/mL Final     ASSESSMENT / PLAN  1. Hypothyroidism, postradioiodine therapy     -Patient has longstanding history of postablative hypothyroidism since 2010.  He has been on a stable dose of levothyroxine  200 mcg daily.  He is clinically euthyroid in the clinic today.  Plan: - Currently taking levothyroxine  200 mcg daily. -Check thyroid  function test today. - Annual endocrinology follow-up.  Diagnoses and all orders for this visit:  Hypothyroidism, postradioiodine therapy -     T4, free -     TSH   DISPOSITION Follow up in clinic in 12 months suggested.  Labs today and prior to follow-up visit.  All questions answered and patient verbalized understanding of the plan.  Victor Jaydalee Bardwell, MD [facility] [address] [place], [address] [place], [place] [address] Phone # [phone]  At least part of this note was generated using voice recognition software. Inadvertent word errors may have occurred, which were not recognized during the proofreading process.

## 2024-07-19 ENCOUNTER — Ambulatory Visit: Payer: Self-pay | Admitting: Endocrinology

## 2024-09-02 LAB — COLOGUARD: COLOGUARD: POSITIVE — AB

## 2025-07-22 ENCOUNTER — Ambulatory Visit: Admitting: Endocrinology
